# Patient Record
Sex: Female | Born: 1970
Health system: Southern US, Community
[De-identification: ages and names within clinical notes are randomized; demographics above are authoritative.]

## PROBLEM LIST (undated history)

## (undated) DIAGNOSIS — R112 Nausea with vomiting, unspecified: Secondary | ICD-10-CM

## (undated) DIAGNOSIS — Z9889 Other specified postprocedural states: Secondary | ICD-10-CM

## (undated) DIAGNOSIS — I10 Essential (primary) hypertension: Secondary | ICD-10-CM

## (undated) HISTORY — PX: CHOLECYSTECTOMY: SHX55

## (undated) HISTORY — PX: ANKLE SURGERY: SHX546

## (undated) HISTORY — PX: TUBAL LIGATION: SHX77

---

## 2001-10-29 ENCOUNTER — Other Ambulatory Visit: Admission: RE | Admit: 2001-10-29 | Discharge: 2001-10-29 | Payer: Self-pay | Admitting: Family Medicine

## 2002-02-04 ENCOUNTER — Other Ambulatory Visit: Admission: RE | Admit: 2002-02-04 | Discharge: 2002-02-04 | Payer: Self-pay | Admitting: Family Medicine

## 2002-08-19 ENCOUNTER — Encounter: Payer: Self-pay | Admitting: Family Medicine

## 2002-08-19 ENCOUNTER — Ambulatory Visit (HOSPITAL_COMMUNITY): Admission: RE | Admit: 2002-08-19 | Discharge: 2002-08-19 | Payer: Self-pay | Admitting: Family Medicine

## 2003-07-15 ENCOUNTER — Emergency Department (HOSPITAL_COMMUNITY): Admission: EM | Admit: 2003-07-15 | Discharge: 2003-07-15 | Payer: Self-pay | Admitting: Emergency Medicine

## 2005-12-04 ENCOUNTER — Ambulatory Visit: Payer: Self-pay | Admitting: Internal Medicine

## 2005-12-10 ENCOUNTER — Encounter (HOSPITAL_COMMUNITY): Admission: RE | Admit: 2005-12-10 | Discharge: 2006-01-09 | Payer: Self-pay | Admitting: Internal Medicine

## 2006-01-09 ENCOUNTER — Observation Stay (HOSPITAL_COMMUNITY): Admission: RE | Admit: 2006-01-09 | Discharge: 2006-01-10 | Payer: Self-pay | Admitting: General Surgery

## 2006-01-09 ENCOUNTER — Encounter (INDEPENDENT_AMBULATORY_CARE_PROVIDER_SITE_OTHER): Payer: Self-pay | Admitting: General Surgery

## 2010-11-21 ENCOUNTER — Emergency Department (HOSPITAL_COMMUNITY)
Admission: EM | Admit: 2010-11-21 | Discharge: 2010-11-21 | Payer: Self-pay | Source: Home / Self Care | Admitting: Emergency Medicine

## 2010-12-25 ENCOUNTER — Emergency Department (HOSPITAL_COMMUNITY): Payer: Self-pay

## 2010-12-25 ENCOUNTER — Emergency Department (HOSPITAL_COMMUNITY)
Admission: EM | Admit: 2010-12-25 | Discharge: 2010-12-25 | Disposition: A | Discharge status: Home / Self Care | Payer: Self-pay | Attending: Emergency Medicine | Admitting: Emergency Medicine

## 2010-12-25 DIAGNOSIS — N83209 Unspecified ovarian cyst, unspecified side: Secondary | ICD-10-CM | POA: Insufficient documentation

## 2010-12-25 DIAGNOSIS — R109 Unspecified abdominal pain: Secondary | ICD-10-CM | POA: Insufficient documentation

## 2010-12-25 DIAGNOSIS — N760 Acute vaginitis: Secondary | ICD-10-CM | POA: Insufficient documentation

## 2010-12-25 DIAGNOSIS — F329 Major depressive disorder, single episode, unspecified: Secondary | ICD-10-CM | POA: Insufficient documentation

## 2010-12-25 DIAGNOSIS — Z79899 Other long term (current) drug therapy: Secondary | ICD-10-CM | POA: Insufficient documentation

## 2010-12-25 DIAGNOSIS — R10819 Abdominal tenderness, unspecified site: Secondary | ICD-10-CM | POA: Insufficient documentation

## 2010-12-25 DIAGNOSIS — A499 Bacterial infection, unspecified: Secondary | ICD-10-CM | POA: Insufficient documentation

## 2010-12-25 DIAGNOSIS — I10 Essential (primary) hypertension: Secondary | ICD-10-CM | POA: Insufficient documentation

## 2010-12-25 DIAGNOSIS — B9689 Other specified bacterial agents as the cause of diseases classified elsewhere: Secondary | ICD-10-CM | POA: Insufficient documentation

## 2010-12-25 DIAGNOSIS — F3289 Other specified depressive episodes: Secondary | ICD-10-CM | POA: Insufficient documentation

## 2010-12-25 DIAGNOSIS — R110 Nausea: Secondary | ICD-10-CM | POA: Insufficient documentation

## 2010-12-25 LAB — CBC
HCT: 42 % (ref 36.0–46.0)
Hemoglobin: 14.7 g/dL (ref 12.0–15.0)
MCH: 31.3 pg (ref 26.0–34.0)
MCHC: 35 g/dL (ref 30.0–36.0)
MCV: 89.6 fL (ref 78.0–100.0)
Platelets: 218 K/uL (ref 150–400)
RBC: 4.69 MIL/uL (ref 3.87–5.11)
RDW: 13.1 % (ref 11.5–15.5)
WBC: 7.6 K/uL (ref 4.0–10.5)

## 2010-12-25 LAB — URINALYSIS, ROUTINE W REFLEX MICROSCOPIC
Bilirubin Urine: NEGATIVE
Hgb urine dipstick: NEGATIVE
Ketones, ur: NEGATIVE mg/dL
Nitrite: NEGATIVE
Protein, ur: NEGATIVE mg/dL
Specific Gravity, Urine: 1.015 (ref 1.005–1.030)
Urine Glucose, Fasting: NEGATIVE mg/dL
Urobilinogen, UA: 0.2 mg/dL (ref 0.0–1.0)
pH: 6 (ref 5.0–8.0)

## 2010-12-25 LAB — DIFFERENTIAL
Basophils Absolute: 0 K/uL (ref 0.0–0.1)
Basophils Relative: 0 % (ref 0–1)
Eosinophils Absolute: 0.1 10*3/uL (ref 0.0–0.7)
Eosinophils Relative: 1 % (ref 0–5)
Lymphocytes Relative: 24 % (ref 12–46)
Lymphs Abs: 1.8 10*3/uL (ref 0.7–4.0)
Monocytes Absolute: 0.5 10*3/uL (ref 0.1–1.0)
Monocytes Relative: 7 % (ref 3–12)
Neutro Abs: 5.1 10*3/uL (ref 1.7–7.7)
Neutrophils Relative %: 68 % (ref 43–77)

## 2010-12-25 LAB — BASIC METABOLIC PANEL WITH GFR
BUN: 9 mg/dL (ref 6–23)
CO2: 29 meq/L (ref 19–32)
Chloride: 101 meq/L (ref 96–112)
Creatinine, Ser: 0.88 mg/dL (ref 0.4–1.2)
Glucose, Bld: 89 mg/dL (ref 70–99)
Potassium: 4.2 meq/L (ref 3.5–5.1)

## 2010-12-25 LAB — BASIC METABOLIC PANEL
Calcium: 9.8 mg/dL (ref 8.4–10.5)
GFR calc Af Amer: >60 mL/min (ref >60)
GFR calc non Af Amer: >60 mL/min (ref >60)
Sodium: 140 mEq/L (ref 135–145)

## 2010-12-25 LAB — WET PREP, GENITAL
Trich, Wet Prep: NONE SEEN
Yeast Wet Prep HPF POC: NONE SEEN

## 2010-12-25 LAB — RPR: RPR Ser Ql: NONREACTIVE

## 2010-12-25 LAB — POCT PREGNANCY, URINE: Preg Test, Ur: NEGATIVE

## 2010-12-25 MED ORDER — IOHEXOL 300 MG/ML  SOLN: 100.0000 mL | Freq: Once | INTRAMUSCULAR | Status: DC | PRN

## 2010-12-26 LAB — GC/CHLAMYDIA PROBE AMP, GENITAL
Chlamydia, DNA Probe: NEGATIVE
GC Probe Amp, Genital: NEGATIVE

## 2011-04-04 NOTE — H&P (Signed)
[name], Jessica [name]              ACCOUNT NO.:  [id number]   MEDICAL RECORD NO.:  [id number]          PATIENT TYPE:  AMB   LOCATION:  DAY                           FACILITY:  [facility]   PHYSICIAN:  Leroy C. Katrinka Blazing, M.D.   DATE OF BIRTH:  1971/10/19   DATE OF ADMISSION:  DATE OF DISCHARGE:  LH                                HISTORY & PHYSICAL   HISTORY OF PRESENT ILLNESS:  The patient is a 40 year old female with a 4-  month history of right flank pain, back pain with postprandial nausea  without vomiting.  The patient has upper epigastric discomfort and  indigestion, which is controlled by AcipHex.  Ultrasound was negative,  except for hepatomegaly.  HIDA scan showed an ejection fraction of 16.5%.  Other evaluation including LFT's, hepatitis screen was negative.  The  patient is scheduled to have laparoscopic cholecystectomy and liver biopsy  for evaluation of her hepatomegaly.   PAST HISTORY:  1.  Depression.  2.  Anxiety.  3.  Seasonal allergies.  4.  Gastroesophageal reflux disease.   MEDICATIONS:  1.  Zyrtec 10 mg daily.  2.  AcipHex 10 mg daily.  3.  Darvocet p.r.n.  4.  Xanax 0.5 mg p.r.n.   ALLERGIES:  SULFA.   REVIEW OF SYSTEMS:  Positive for chronic fatigue, weight gain, abdominal  bloating.   PHYSICAL EXAMINATION:  VITAL SIGNS:  Blood pressure 162/100, pulse 106,  respirations 20, weight 334 pounds.  HEENT:  Unremarkable.  NECK:  Supple.  No JVD, bruit, adenopathy or thyromegaly.  CHEST:  Clear to auscultation.  HEART:  Regular rate and rhythm without murmur, gallop, or rub.  ABDOMEN:  Soft, nontender.  No masses.  Obese.  Normoactive bowel sounds.  EXTREMITIES:  No cyanosis or clubbing.  There was 1+ edema bilaterally.  NEUROLOGIC:  No focal motor, sensory or cerebellar deficits.   IMPRESSION:  1.  Chronic cholecystitis.  2.  Hepatomegaly of uncertain etiology.  3.  Hypertension.   PLAN:  The patient will have laparoscopic cholecystectomy with liver  biopsy.      Dirk Dress. Katrinka Blazing, M.D.  Electronically Signed     LCS/MEDQ  D:  01/08/2006  T:  01/08/2006  Job:  [id number]   cc:   Kirstie Peri, MD  Fax: [phone]

## 2011-04-04 NOTE — Op Note (Signed)
Jessica [name], Jessica [name]              ACCOUNT NO.:  [id number]   MEDICAL RECORD NO.:  [id number]          PATIENT TYPE:  OBV   LOCATION:  A428                          FACILITY:  [facility]   PHYSICIAN:  Jerolyn Shin C. Katrinka Blazing, M.D.   DATE OF BIRTH:  08-03-1971   DATE OF PROCEDURE:  01/09/2006  DATE OF DISCHARGE:  01/10/2006                                 OPERATIVE REPORT   PREOPERATIVE DIAGNOSIS:  Chronic cholecystitis, hepatomegaly.   POSTOPERATIVE DIAGNOSIS:  Chronic cholecystitis, hepatomegaly with diffuse  fatty infiltration of the liver.   PROCEDURE:  Laparoscopic cholecystectomy with liver biopsy.   SURGEON:  Dr. Katrinka Blazing.   DESCRIPTION:  Under general anesthesia the patient's abdomen was prepped and  draped in a sterile field.  Supraumbilical incision was made.  Veress needle  was inserted uneventfully.  Abdomen was insufflated with 2.5 liters of CO2.  Using an Visiport guide 10 mm port was placed.  Laparoscope was placed.  Under videoscopic guidance a 10-mm port and two 5 mm ports were placed in  the right subcostal region.  The patient was placed in reverse Trendelenburg  position.  Gallbladder was grasped and positioned.  Cystic duct was  dissected, clipped with five clips and divided.  Cystic artery was  dissected, clipped with three clips and divided.  Gallbladder was then  separated from its infrahepatic bed without difficulty.  It was placed in an  EndoCatch device and retrieved.  CO2 insufflation was discontinued.  CO2 was  allowed to escape from the abdomen so that there was better access to the  liver.  Under videoscopic guidance a long liver biopsy needle was placed  transabdominally.  Three good cores of liver tissue were obtained from the  separate areas in the right lobe of the liver.  The areas of biopsy were  cauterized and there was no bleeding.  Irrigation was carried out.  The  remainder of the CO2 was allowed to escape from the abdomen.  The ports were  removed.  The  abdomen was closed using 0 Vicryl on the fascia of the  umbilicus and staples on the skin.  OpSite dressings were placed.  The  patient tolerated procedure well.  She was awakened from anesthesia,  transferred to a bed and taken to the postanesthetic care unit for  monitoring.      Dirk Dress. Katrinka Blazing, M.D.  Electronically Signed     LCS/MEDQ  D:  04/04/2006  T:  04/06/2006  Job:  [id number]

## 2011-10-09 ENCOUNTER — Emergency Department (HOSPITAL_COMMUNITY)
Admission: EM | Admit: 2011-10-09 | Discharge: 2011-10-09 | Disposition: A | Discharge status: Home / Self Care | Payer: Self-pay | Attending: Emergency Medicine | Admitting: Emergency Medicine

## 2011-10-09 ENCOUNTER — Emergency Department (HOSPITAL_COMMUNITY): Payer: Self-pay

## 2011-10-09 DIAGNOSIS — I10 Essential (primary) hypertension: Secondary | ICD-10-CM | POA: Insufficient documentation

## 2011-10-09 DIAGNOSIS — J209 Acute bronchitis, unspecified: Secondary | ICD-10-CM | POA: Insufficient documentation

## 2011-10-09 HISTORY — DX: Essential (primary) hypertension: I10

## 2011-10-09 MED ORDER — AZITHROMYCIN 250 MG PO TABS: 250.0000 mg | ORAL_TABLET | Freq: Every day | ORAL | Status: AC

## 2011-10-09 MED ORDER — HYDROCODONE-ACETAMINOPHEN 5-325 MG PO TABS: 2.0000 | ORAL_TABLET | ORAL | Status: AC | PRN

## 2011-10-09 NOTE — ED Notes (Signed)
Pt presents with cough and URI symptoms x 1 week. Pt states sputum is clear and cough is productive

## 2011-10-09 NOTE — ED Provider Notes (Signed)
Scribed for Felisa Bonier, MD, the patient was seen in room APFT22/APFT22. This chart was scribed by [employer]. The patient's care started at 13:30  CSN: [id number] Arrival date & time: 10/09/2011  1:32 PM   First MD Initiated Contact with Patient 10/09/11 1330      Chief Complaint  Patient presents with  . Cough  . URI   Patient is a 40 y.o. female presenting with cough. The history is provided by the patient.  Cough This is a new problem. The current episode started more than 1 week ago. The problem occurs every few minutes. The problem has been gradually worsening. The cough is productive of sputum. There has been no fever. Associated symptoms include ear congestion, ear pain and myalgias. Pertinent negatives include no chest pain, no chills, no sweats, no headaches, no rhinorrhea, no sore throat, no shortness of breath, no wheezing and no eye redness. She has tried decongestants and cough syrup for the symptoms. The treatment provided mild relief. She is not a smoker.   Jessica [name] is a 40 y.o. female who presents to the Emergency Department complaining of Cough and URI for about 1 week. She reports nasal congestion, productive cough (clear sputum) and fevers. She states that she has taken some Mucinex without any alleviation. She states that she can hear herself wheeze. Patient is actively coughing.  Past Medical History  Diagnosis Date  . Hypertension     Past Surgical History  Procedure Date  . Cholecystectomy   . Tubal ligation   . Ankle surgery     No family history on file.  History  Substance Use Topics  . Smoking status: Never Smoker   . Smokeless tobacco: Not on file  . Alcohol Use: No    OB History    Grav Para Term Preterm Abortions TAB SAB Ect Mult Living                  Review of Systems  Constitutional: Positive for fever. Negative for chills.       10 Systems reviewed and are negative for acute change except as noted in the HPI.  HENT:  Positive for ear pain and congestion. Negative for sore throat and rhinorrhea.   Eyes: Negative for discharge and redness.  Respiratory: Positive for cough. Negative for shortness of breath and wheezing.   Cardiovascular: Negative for chest pain.  Gastrointestinal: Negative for vomiting, abdominal pain and diarrhea.  Genitourinary: Negative for dysuria.  Musculoskeletal: Positive for myalgias. Negative for back pain.  Skin: Negative for rash.  Neurological: Negative for dizziness, syncope, speech difficulty, weakness, numbness and headaches.  Psychiatric/Behavioral: Negative for suicidal ideas, hallucinations and confusion.  All other systems reviewed and are negative.    Allergies  Sulfa antibiotics  Home Medications  No current outpatient prescriptions on file.  BP 132/84  Pulse 88  Temp(Src) 98.1 F (36.7 C) (Oral)  Resp 20  Ht 5\' 9"  (1.753 m)  Wt 309 lb (140.161 kg)  BMI 45.63 kg/m2  SpO2 100%  LMP 10/01/2011  Physical Exam  Nursing note and vitals reviewed. Constitutional: She is oriented to person, place, and time. She appears well-developed and well-nourished.  Non-toxic appearance. She does not have a sickly appearance.  HENT:  Head: Normocephalic and atraumatic.  Right Ear: Hearing, external ear and ear canal normal. No drainage. No mastoid tenderness. Tympanic membrane is not injected, not perforated, not erythematous and not bulging. A middle ear effusion is present. No hemotympanum. No decreased  hearing is noted.  Left Ear: Hearing, external ear and ear canal normal. No drainage. No mastoid tenderness. Tympanic membrane is not injected, not perforated, not erythematous and not bulging. A middle ear effusion is present. No hemotympanum. No decreased hearing is noted.  Nose: Mucosal edema and rhinorrhea present. Right sinus exhibits no maxillary sinus tenderness and no frontal sinus tenderness. Left sinus exhibits no maxillary sinus tenderness and no frontal sinus  tenderness.  Mouth/Throat: Uvula is midline, oropharynx is clear and moist and mucous membranes are normal. No posterior oropharyngeal edema or posterior oropharyngeal erythema.       Some congestion behind the TMs but no erythema bilaterally.   Eyes: Conjunctivae, EOM and lids are normal. Pupils are equal, round, and reactive to light. No scleral icterus.  Neck: Trachea normal and normal range of motion. Neck supple.  Cardiovascular: Regular rhythm and normal heart sounds.   Pulmonary/Chest: Effort normal and breath sounds normal. No accessory muscle usage. Not tachypneic. No respiratory distress. She has no decreased breath sounds. She has no wheezes. She has no rhonchi. She has no rales.  Abdominal: Soft. Normal appearance and bowel sounds are normal. There is no tenderness. There is no rebound, no guarding and no CVA tenderness.  Musculoskeletal: Normal range of motion.  Neurological: She is alert and oriented to person, place, and time. She has normal strength.  Skin: Skin is warm, dry and intact. No rash noted.  Psychiatric: She has a normal mood and affect.    ED Course  Procedures  DIAGNOSTIC STUDIES: Oxygen Saturation is 100% on room air, normal by my interpretation.    COORDINATION OF CARE: 13:35 - EDP examined patient at bedside. Patient to be treated with antibiotics. She is aware of care plan. She understands and is in agreement with plan.   MDM: Acute bronchitis, viral upper respiratory tract infection with cough are both entertained in the patient's differential diagnosis.   Scribe Attestation I personally performed the services described in this documentation, which was scribed in my presence. The recorded information has been reviewed and considered.       Felisa Bonier, MD 10/09/11 [phone]

## 2011-10-27 ENCOUNTER — Emergency Department (HOSPITAL_COMMUNITY): Payer: Self-pay

## 2011-10-27 ENCOUNTER — Encounter (HOSPITAL_COMMUNITY): Payer: Self-pay

## 2011-10-27 ENCOUNTER — Emergency Department (HOSPITAL_COMMUNITY)
Admission: EM | Admit: 2011-10-27 | Discharge: 2011-10-27 | Disposition: A | Discharge status: Home / Self Care | Payer: Self-pay | Attending: Emergency Medicine | Admitting: Emergency Medicine

## 2011-10-27 DIAGNOSIS — R05 Cough: Secondary | ICD-10-CM | POA: Insufficient documentation

## 2011-10-27 DIAGNOSIS — R059 Cough, unspecified: Secondary | ICD-10-CM | POA: Insufficient documentation

## 2011-10-27 DIAGNOSIS — I10 Essential (primary) hypertension: Secondary | ICD-10-CM | POA: Insufficient documentation

## 2011-10-27 MED ORDER — GUAIFENESIN-CODEINE 100-10 MG/5ML PO SYRP: ORAL_SOLUTION | ORAL | Status: DC

## 2011-10-27 NOTE — ED Notes (Signed)
Persistent dry cough for 2-3 weeks

## 2011-10-27 NOTE — ED Provider Notes (Signed)
History     CSN: [id number] Arrival date & time: 10/27/2011  1:54 PM   None     Chief Complaint  Patient presents with  . Cough    (Consider location/radiation/quality/duration/timing/severity/associated sxs/prior treatment) HPI Comments: Pt seen here on thanksgiving today with same sxs.  Given a z-pack with no improvement.    Thinks she needs another one.  Patient is a 40 y.o. female presenting with cough. The history is provided by the patient.  Cough This is a recurrent problem. The problem occurs constantly. The cough is non-productive. There has been no fever. Pertinent negatives include no chills, no shortness of breath and no wheezing. Treatments tried: z-pack. The treatment provided no relief. She is not a smoker.    Past Medical History  Diagnosis Date  . Hypertension     Past Surgical History  Procedure Date  . Cholecystectomy   . Tubal ligation   . Ankle surgery     No family history on file.  History  Substance Use Topics  . Smoking status: Never Smoker   . Smokeless tobacco: Not on file  . Alcohol Use: No    OB History    Grav Para Term Preterm Abortions TAB SAB Ect Mult Living                  Review of Systems  Constitutional: Negative for fever and chills.  Respiratory: Positive for cough. Negative for shortness of breath, wheezing and stridor.   All other systems reviewed and are negative.    Allergies  Sulfa antibiotics  Home Medications     BP 143/91  Pulse 76  Temp(Src) 98.3 F (36.8 C) (Oral)  Resp 18  Ht 5\' 8"  (1.727 m)  Wt 309 lb (140.161 kg)  BMI 46.98 kg/m2  SpO2 100%  LMP 10/01/2011  Physical Exam  Nursing note and vitals reviewed. Constitutional: She is oriented to person, place, and time. She appears well-developed and well-nourished. No distress.  HENT:  Head: Normocephalic and atraumatic.  Right Ear: Tympanic membrane, external ear and ear canal normal.  Left Ear: Hearing, tympanic membrane, external ear and  ear canal normal.  Mouth/Throat: Uvula is midline and mucous membranes are normal. No uvula swelling. No oropharyngeal exudate, posterior oropharyngeal edema, posterior oropharyngeal erythema or tonsillar abscesses.  Eyes: EOM are normal.  Neck: Normal range of motion.  Cardiovascular: Normal rate, regular rhythm and normal heart sounds.  Exam reveals no gallop and no friction rub.   No murmur heard. Pulmonary/Chest: Effort normal and breath sounds normal. No accessory muscle usage. Not tachypneic. No respiratory distress. She has no wheezes. She has no rhonchi. She has no rales. She exhibits no tenderness.  Abdominal: Soft. She exhibits no distension. There is no tenderness.  Musculoskeletal: Normal range of motion.  Lymphadenopathy:    She has no cervical adenopathy.  Neurological: She is alert and oriented to person, place, and time.  Skin: Skin is warm and dry.  Psychiatric: She has a normal mood and affect. Judgment normal.    ED Course  Procedures (including critical care time)  Labs Reviewed - No data to display Dg Chest 2 View  10/27/2011  *RADIOLOGY REPORT*  Clinical Data: Cough and congestion  CHEST - 2 VIEW  Comparison: None.  Findings: Heart size is within normal limits.  Normal vascularity. Negative for pneumonia or effusion.  Lungs are clear.  IMPRESSION: No active cardiopulmonary disease.  Original Report Authenticated By: Camelia Phenes, M.D.     No  diagnosis found.    MDM          Worthy Rancher, PA 10/27/11 [phone]

## 2011-10-27 NOTE — ED Notes (Signed)
C/o cough since week of Thanksgiving. Pt states " diagnosed with bronchitis and it is not getting any better"

## 2011-10-28 NOTE — ED Provider Notes (Signed)
Medical screening examination/treatment/procedure(s) were performed by non-physician practitioner and as supervising physician I was immediately available for consultation/collaboration.    Joya Gaskins, MD 10/28/11 [phone]

## 2016-01-25 ENCOUNTER — Other Ambulatory Visit: Payer: Self-pay | Admitting: Orthopaedic Surgery

## 2016-01-26 NOTE — Telephone Encounter (Signed)
rx done

## 2016-02-20 ENCOUNTER — Ambulatory Visit: Payer: Self-pay | Admitting: Orthopaedic Surgery

## 2016-03-12 ENCOUNTER — Ambulatory Visit (INDEPENDENT_AMBULATORY_CARE_PROVIDER_SITE_OTHER): Payer: BLUE CROSS/BLUE SHIELD | Admitting: Orthopaedic Surgery

## 2016-03-12 ENCOUNTER — Encounter: Payer: Self-pay | Admitting: Orthopaedic Surgery

## 2016-03-12 VITALS — BP 136/89 | HR 87 | Temp 97.7°F | Resp 16 | Ht 68.0 in | Wt 300.0 lb

## 2016-03-12 DIAGNOSIS — M79671 Pain in right foot: Secondary | ICD-10-CM | POA: Diagnosis not present

## 2016-03-12 DIAGNOSIS — G8929 Other chronic pain: Secondary | ICD-10-CM | POA: Diagnosis not present

## 2016-03-12 MED ORDER — HYDROCODONE-ACETAMINOPHEN 5-325 MG PO TABS: 1.0000 | ORAL_TABLET | ORAL | Status: DC | PRN

## 2016-03-12 NOTE — Progress Notes (Signed)
Patient ON:GEXBMWU:Jessica [name], female DOB:05/17/1971, 45 y.o. XLK:[id number]  Chief Complaint  Patient presents with  . Follow-up    right heel pain     HPI  Jessica LevelFelicia G [name] is a 45 y.o. female who has chronic right heel pain.  She has no new problem.  She has inserts, uses ice, elevates the heel.  It hurts first thing in the morning and after sitting a while.  She has done stretching exercises.  She is taking her medicine.  She has no swelling, no redness.  HPI  Body mass index is 45.63 kg/(m^2).  Review of Systems  HENT: Negative for congestion.   Respiratory: Negative for cough and shortness of breath.   Cardiovascular: Negative for chest pain and leg swelling.  Endocrine: Positive for cold intolerance.  Musculoskeletal: Positive for arthralgias and gait problem.  Allergic/Immunologic: Positive for environmental allergies.    Past Medical History  Diagnosis Date  . Hypertension     Past Surgical History  Procedure Laterality Date  . Cholecystectomy    . Tubal ligation    . Ankle surgery      History reviewed. No pertinent family history.  Social History Social History  Substance Use Topics  . Smoking status: Never Smoker   . Smokeless tobacco: None  . Alcohol Use: No    Allergies  Allergen Reactions  . Sulfa Antibiotics Rash    Current Outpatient Prescriptions  Medication Sig Dispense Refill  . atenolol (TENORMIN) 25 MG tablet Take 25 mg by mouth daily.      . cetirizine (ZYRTEC) 10 MG tablet Take 10 mg by mouth daily.      . diclofenac (VOLTAREN) 75 MG EC tablet TAKE ONE TABLET BY MOUTH TWICE DAILY 60 tablet 5  . DULoxetine (CYMBALTA) 30 MG capsule Take 30 mg by mouth daily.    . norethindrone (MICRONOR,CAMILA,ERRIN) 0.35 MG tablet Take 1 tablet by mouth daily.      Marland Kitchen. HYDROcodone-acetaminophen (NORCO/VICODIN) 5-325 MG tablet Take 1 tablet by mouth every 4 (four) hours as needed for moderate pain (Must last 30 days.  Do not take and drive a car or use  machinery.). 120 tablet 0   No current facility-administered medications for this visit.     Physical Exam  Blood pressure 136/89, pulse 87, temperature 97.7 F (36.5 C), resp. rate 16, height 5\' 8"  (1.727 m), weight 300 lb (136.079 kg).  Constitutional: overall normal hygiene, normal nutrition, well developed, normal grooming, normal body habitus. Assistive device:none  Musculoskeletal: gait and station Limp none, muscle tone and strength are normal, no tremors or atrophy is present.  .  Neurological: coordination overall normal.  Deep tendon reflex/nerve stretch intact.  Sensation normal.  Cranial nerves II-XII intact.   Skin:   normal overall no scars, lesions, ulcers or rashes. No psoriasis.  Psychiatric: Alert and oriented x 3.  Recent memory intact, remote memory unclear.  Normal mood and affect. Well groomed.  Good eye contact.  Cardiovascular: overall no swelling, no varicosities, no edema bilaterally, normal temperatures of the legs and arms, no clubbing, cyanosis and good capillary refill.  Lymphatic: palpation is normal.   Extremities:the right heel is tender in the center area, the left heel is normal. Inspection normal Strength and tone normal Range of motion full of both feet and ankles.  The patient has been educated about the nature of the problem(s) and counseled on treatment options.  The patient appeared to understand what I have discussed and is in agreement with it.  Encounter Diagnosis  Name Primary?  . Heel pain, chronic, right Yes    PLAN Call if any problems.  Precautions discussed.  Continue current medications.   Return to clinic 3 months

## 2016-03-13 ENCOUNTER — Ambulatory Visit: Payer: Self-pay | Admitting: Orthopaedic Surgery

## 2016-06-18 ENCOUNTER — Ambulatory Visit (INDEPENDENT_AMBULATORY_CARE_PROVIDER_SITE_OTHER): Payer: BLUE CROSS/BLUE SHIELD | Admitting: Orthopaedic Surgery

## 2016-06-18 ENCOUNTER — Encounter: Payer: Self-pay | Admitting: Orthopaedic Surgery

## 2016-06-18 VITALS — BP 135/85 | HR 87 | Temp 97.7°F | Ht 68.0 in | Wt 308.0 lb

## 2016-06-18 DIAGNOSIS — M79672 Pain in left foot: Secondary | ICD-10-CM

## 2016-06-18 DIAGNOSIS — G8929 Other chronic pain: Secondary | ICD-10-CM | POA: Diagnosis not present

## 2016-06-18 DIAGNOSIS — M79671 Pain in right foot: Secondary | ICD-10-CM | POA: Diagnosis not present

## 2016-06-18 MED ORDER — HYDROCODONE-ACETAMINOPHEN 5-325 MG PO TABS: 1.0000 | ORAL_TABLET | ORAL | 0 refills | Status: DC | PRN

## 2016-06-18 NOTE — Progress Notes (Signed)
Patient Jessica [name], female DOB:[birth year], 45 y.o. NWG:[id number]  Chief Complaint  Patient presents with  . New Problem    Left foot pain    HPI  Jessica [name] is a 45 y.o. female who has chronic pain of the right heel that is improved.  She has now new pain of the left heel in the same area as the right. She has no new trauma.  She has no redness or swelling.  She does not have a limp. She is doing the stretching and using ice which helps.  HPI  Body mass index is 46.83 kg/m.  ROS  Review of Systems  HENT: Negative for congestion.   Respiratory: Negative for cough and shortness of breath.   Cardiovascular: Negative for chest pain and leg swelling.  Endocrine: Positive for cold intolerance.  Musculoskeletal: Positive for arthralgias and gait problem.  Allergic/Immunologic: Positive for environmental allergies.    Past Medical History:  Diagnosis Date  . Hypertension     Past Surgical History:  Procedure Laterality Date  . ANKLE SURGERY    . CHOLECYSTECTOMY    . TUBAL LIGATION      History reviewed. No pertinent family history.  Social History Social History  Substance Use Topics  . Smoking status: Never Smoker  . Smokeless tobacco: Never Used  . Alcohol use No    Allergies  Allergen Reactions  . Sulfa Antibiotics Rash    Current Outpatient Prescriptions  Medication Sig Dispense Refill  . atenolol (TENORMIN) 25 MG tablet Take 25 mg by mouth daily.      . cetirizine (ZYRTEC) 10 MG tablet Take 10 mg by mouth daily.      . diclofenac (VOLTAREN) 75 MG EC tablet TAKE ONE TABLET BY MOUTH TWICE DAILY 60 tablet 5  . DULoxetine (CYMBALTA) 30 MG capsule Take 30 mg by mouth daily.    Marland Kitchen HYDROcodone-acetaminophen (NORCO/VICODIN) 5-325 MG tablet Take 1 tablet by mouth every 4 (four) hours as needed for moderate pain (Must last 30 days.  Do not take and drive a car or use machinery.). 120 tablet 0  . norethindrone (MICRONOR,CAMILA,ERRIN) 0.35 MG tablet Take 1  tablet by mouth daily.       No current facility-administered medications for this visit.      Physical Exam  Blood pressure 135/85, pulse 87, temperature 97.7 F (36.5 C), height  (1.727 m), weight (!) 308 lb (139.7 kg).  Constitutional: overall normal hygiene, normal nutrition, well developed, normal grooming, normal body habitus. Assistive device:none  Musculoskeletal: gait and station Limp left, muscle tone and strength are normal, no tremors or atrophy is present.  .  Neurological: coordination overall normal.  Deep tendon reflex/nerve stretch intact.  Sensation normal.  Cranial nerves II-XII intact.   Skin:   normal overall no scars, lesions, ulcers or rashes. No psoriasis.  Psychiatric: Alert and oriented x 3.  Recent memory intact, remote memory unclear.  Normal mood and affect. Well groomed.  Good eye contact.  Cardiovascular: overall no swelling, no varicosities, no edema bilaterally, normal temperatures of the legs and arms, no clubbing, cyanosis and good capillary refill.  Lymphatic: palpation is normal.  The right heel is only slightly tender.  She has no swelling or redness.  The left heel has some tenderness but no swelling or redness. There is a slight limp to the left.  ROM of both ankles full.  The patient has been educated about the nature of the problem(s) and counseled on treatment options.  The patient appeared to understand what I have discussed and is in agreement with it.  Encounter Diagnoses  Name Primary?  . Heel pain, chronic, right Yes  . Heel pain, left     PLAN Call if any problems.  Precautions discussed.  Continue current medications.   Return to clinic 3 months   Electronically Signed Darreld Mclean, MD 8/2/201710:47 AM

## 2016-09-23 ENCOUNTER — Encounter: Payer: Self-pay | Admitting: Orthopaedic Surgery

## 2016-09-23 ENCOUNTER — Ambulatory Visit (INDEPENDENT_AMBULATORY_CARE_PROVIDER_SITE_OTHER): Payer: BLUE CROSS/BLUE SHIELD | Admitting: Orthopaedic Surgery

## 2016-09-23 VITALS — BP 118/81 | HR 84 | Temp 97.7°F | Ht 68.0 in | Wt 314.0 lb

## 2016-09-23 DIAGNOSIS — G8929 Other chronic pain: Secondary | ICD-10-CM | POA: Diagnosis not present

## 2016-09-23 DIAGNOSIS — M79671 Pain in right foot: Secondary | ICD-10-CM | POA: Diagnosis not present

## 2016-09-23 MED ORDER — HYDROCODONE-ACETAMINOPHEN 5-325 MG PO TABS: 1.0000 | ORAL_TABLET | ORAL | 0 refills | Status: DC | PRN

## 2016-09-23 NOTE — Progress Notes (Signed)
Patient Jessica [name], female DOB:10/08/1971, 45 y.o. NWG:[id number]  Chief Complaint  Patient presents with  . Follow-up    Heel pain    HPI  [name] is a 45 y.o. female who has chronic pain of the right heel. It is better today. She has no redness or new trauma. She has no numbness or swelling. HPI  Body mass index is 47.74 kg/m.  ROS  Review of Systems  HENT: Negative for congestion.   Respiratory: Negative for cough and shortness of breath.   Cardiovascular: Negative for chest pain and leg swelling.  Endocrine: Positive for cold intolerance.  Musculoskeletal: Positive for arthralgias and gait problem.  Allergic/Immunologic: Positive for environmental allergies.    Past Medical History:  Diagnosis Date  . Hypertension     Past Surgical History:  Procedure Laterality Date  . ANKLE SURGERY    . CHOLECYSTECTOMY    . TUBAL LIGATION      History reviewed. No pertinent family history.  Social History Social History  Substance Use Topics  . Smoking status: Never Smoker  . Smokeless tobacco: Never Used  . Alcohol use No    Allergies  Allergen Reactions  . Sulfa Antibiotics Rash    Current Outpatient Prescriptions  Medication Sig Dispense Refill  . atenolol (TENORMIN) 25 MG tablet Take 25 mg by mouth daily.      . cetirizine (ZYRTEC) 10 MG tablet Take 10 mg by mouth daily.      . diclofenac (VOLTAREN) 75 MG EC tablet TAKE ONE TABLET BY MOUTH TWICE DAILY 60 tablet 5  . DULoxetine (CYMBALTA) 30 MG capsule Take 30 mg by mouth daily.    Marland Kitchen. HYDROcodone-acetaminophen (NORCO/VICODIN) 5-325 MG tablet Take 1 tablet by mouth every 4 (four) hours as needed for moderate pain (Must last 30 days.  Do not take and drive a car or use machinery.). 120 tablet 0  . norethindrone (MICRONOR,CAMILA,ERRIN) 0.35 MG tablet Take 1 tablet by mouth daily.       No current facility-administered medications for this visit.      Physical Exam  Blood pressure 118/81, pulse  84, temperature 97.7 F (36.5 C), height 5\' 8"  (1.727 m), weight (!) 314 lb (142.4 kg).  Constitutional: overall normal hygiene, normal nutrition, well developed, normal grooming, normal body habitus. Assistive device:none  Musculoskeletal: gait and station Limp none, muscle tone and strength are normal, no tremors or atrophy is present.  .  Neurological: coordination overall normal.  Deep tendon reflex/nerve stretch intact.  Sensation normal.  Cranial nerves II-XII intact.   Skin:   Normal overall no scars, lesions, ulcers or rashes. No psoriasis.  Psychiatric: Alert and oriented x 3.  Recent memory intact, remote memory unclear.  Normal mood and affect. Well groomed.  Good eye contact.  Cardiovascular: overall no swelling, no varicosities, no edema bilaterally, normal temperatures of the legs and arms, no clubbing, cyanosis and good capillary refill.  Lymphatic: palpation is normal.  He right heel is tender slightly on the plantar surface.  She has no redness or swelling.  Gait is normal.  The patient has been educated about the nature of the problem(s) and counseled on treatment options.  The patient appeared to understand what I have discussed and is in agreement with it.  Encounter Diagnosis  Name Primary?  . Heel pain, chronic, right Yes    PLAN Call if any problems.  Precautions discussed.  Continue current medications.   Return to clinic prn   Electronically Signed Deniece PortelaWayne  Hilda LiasKeeling, MD 11/7/20179:29 AM

## 2016-09-30 ENCOUNTER — Encounter: Payer: Self-pay | Admitting: Radiology

## 2016-09-30 ENCOUNTER — Ambulatory Visit: Payer: BLUE CROSS/BLUE SHIELD | Admitting: Orthopaedic Surgery

## 2016-09-30 NOTE — Progress Notes (Unsigned)
The Rx for #120 Hydrocodone 5-325mg  is not approved by [employer] of [place].  The patient was given a 7 day supply from [facility] and a prior Aauthorization request sent to us to do.  I called and [facility] 30 minutes [facility] and never got an ans.  I called the patient to tell her she will need to find out what the requirements of her insurance is before any more pain medicine can be prescribed.

## 2016-10-06 NOTE — Progress Notes (Signed)
Noted. Refused.  

## 2017-05-26 ENCOUNTER — Encounter: Payer: Self-pay | Admitting: Orthopaedic Surgery

## 2017-05-26 ENCOUNTER — Ambulatory Visit (INDEPENDENT_AMBULATORY_CARE_PROVIDER_SITE_OTHER): Payer: 59 | Admitting: Orthopaedic Surgery

## 2017-05-26 VITALS — BP 125/82 | HR 92 | Temp 96.9°F | Ht 68.0 in | Wt 326.0 lb

## 2017-05-26 DIAGNOSIS — G8929 Other chronic pain: Secondary | ICD-10-CM | POA: Diagnosis not present

## 2017-05-26 DIAGNOSIS — M79671 Pain in right foot: Secondary | ICD-10-CM | POA: Diagnosis not present

## 2017-05-26 DIAGNOSIS — M79672 Pain in left foot: Secondary | ICD-10-CM | POA: Diagnosis not present

## 2017-05-26 MED ORDER — HYDROCODONE-ACETAMINOPHEN 5-325 MG PO TABS: 1.0000 | ORAL_TABLET | Freq: Four times a day (QID) | ORAL | 0 refills | Status: DC | PRN

## 2017-05-26 NOTE — Progress Notes (Signed)
Patient ZO:XWRUEAV Curley Spice, female DOB:February 07, 1971, 46 y.o. WUJ:[id number]  Chief Complaint  Patient presents with  . Follow-up    heel pain    HPI  VERNEE BAINES is a 46 y.o. female who has chronic and recurrent heel pain.  She has been doing her stretching exercises.  She has no new trauma. She is working more.    I have told her to get some new inserts for her shoes and continue the stretching plus take NSAIDs. HPI  Body mass index is 49.57 kg/m.  ROS  Review of Systems  HENT: Negative for congestion.   Respiratory: Negative for cough and shortness of breath.   Cardiovascular: Negative for chest pain and leg swelling.  Endocrine: Positive for cold intolerance.  Musculoskeletal: Positive for arthralgias and gait problem.  Allergic/Immunologic: Positive for environmental allergies.    Past Medical History:  Diagnosis Date  . Hypertension     Past Surgical History:  Procedure Laterality Date  . ANKLE SURGERY    . CHOLECYSTECTOMY    . TUBAL LIGATION      No family history on file.  Social History Social History  Substance Use Topics  . Smoking status: Never Smoker  . Smokeless tobacco: Never Used  . Alcohol use No    Allergies  Allergen Reactions  . Sulfa Antibiotics Rash    Current Outpatient Prescriptions  Medication Sig Dispense Refill  . atenolol (TENORMIN) 25 MG tablet Take 25 mg by mouth daily.      . cetirizine (ZYRTEC) 10 MG tablet Take 10 mg by mouth daily.      . diclofenac (VOLTAREN) 75 MG EC tablet TAKE ONE TABLET BY MOUTH TWICE DAILY 60 tablet 5  . DULoxetine (CYMBALTA) 30 MG capsule Take 30 mg by mouth daily.    Marland Kitchen HYDROcodone-acetaminophen (NORCO/VICODIN) 5-325 MG tablet Take 1 tablet by mouth every 6 (six) hours as needed for moderate pain (Must last 30 days.Do not take and drive a car or use machinery.). 60 tablet 0  . norethindrone (MICRONOR,CAMILA,ERRIN) 0.35 MG tablet Take 1 tablet by mouth daily.       No current  facility-administered medications for this visit.      Physical Exam  Blood pressure 125/82, pulse 92, temperature (!) 96.9 F (36.1 C), height 5\' 8"  (1.727 m), weight (!) 326 lb (147.9 kg).  Constitutional: overall normal hygiene, normal nutrition, well developed, normal grooming, normal body habitus. Assistive device:none  Musculoskeletal: gait and station Limp left, muscle tone and strength are normal, no tremors or atrophy is present.  .  Neurological: coordination overall normal.  Deep tendon reflex/nerve stretch intact.  Sensation normal.  Cranial nerves II-XII intact.   Skin:   Normal overall no scars, lesions, ulcers or rashes. No psoriasis.  Psychiatric: Alert and oriented x 3.  Recent memory intact, remote memory unclear.  Normal mood and affect. Well groomed.  Good eye contact.  Cardiovascular: overall no swelling, no varicosities, no edema bilaterally, normal temperatures of the legs and arms, no clubbing, cyanosis and good capillary refill.  Lymphatic: palpation is normal.  Both heels are tender, more in the center of each and more on the left.  NV intact. ROM of the ankles and toes normal.    The patient has been educated about the nature of the problem(s) and counseled on treatment options.  The patient appeared to understand what I have discussed and is in agreement with it.  Encounter Diagnoses  Name Primary?  . Heel pain, chronic, right Yes  .  Chronic heel pain, left     PLAN Call if any problems.  Precautions discussed.  Continue current medications.   Return to clinic 1 month   I have [facility] the [facility] web site prior to prescribing narcotic medicine for this patient.  Electronically Signed Darreld McleanWayne Harsha Yusko, MD 7/10/20182:59 PM

## 2017-06-23 ENCOUNTER — Ambulatory Visit: Payer: 59 | Admitting: Orthopaedic Surgery

## 2017-08-04 ENCOUNTER — Ambulatory Visit (INDEPENDENT_AMBULATORY_CARE_PROVIDER_SITE_OTHER): Payer: 59 | Admitting: Orthopaedic Surgery

## 2017-08-04 VITALS — BP 128/86 | HR 83 | Temp 97.2°F | Ht 68.0 in | Wt 326.0 lb

## 2017-08-04 DIAGNOSIS — M79671 Pain in right foot: Secondary | ICD-10-CM | POA: Diagnosis not present

## 2017-08-04 DIAGNOSIS — G8929 Other chronic pain: Secondary | ICD-10-CM

## 2017-08-04 MED ORDER — HYDROCODONE-ACETAMINOPHEN 5-325 MG PO TABS: 1.0000 | ORAL_TABLET | Freq: Four times a day (QID) | ORAL | 0 refills | Status: DC | PRN

## 2017-08-04 NOTE — Progress Notes (Signed)
Patient Jessica [name], female DOB:06/20/1971, 46 y.o. FAO:[id number]  Chief Complaint  Patient presents with  . Follow-up    heel pain    HPI  Jessica [name] is a 46 y.o. female who has bilateral heel pain, more on the left.  She has no new trauma.  She has new shoes.  She is better.  She is doing her stretching exercises. HPI  Body mass index is 49.57 kg/m.  ROS  Review of Systems  Past Medical History:  Diagnosis Date  . Hypertension     Past Surgical History:  Procedure Laterality Date  . ANKLE SURGERY    . CHOLECYSTECTOMY    . TUBAL LIGATION      No family history on file.  Social History Social History  Substance Use Topics  . Smoking status: Never Smoker  . Smokeless tobacco: Never Used  . Alcohol use No    Allergies  Allergen Reactions  . Sulfa Antibiotics Rash    Current Outpatient Prescriptions  Medication Sig Dispense Refill  . atenolol (TENORMIN) 25 MG tablet Take 25 mg by mouth daily.      . cetirizine (ZYRTEC) 10 MG tablet Take 10 mg by mouth daily.      . diclofenac (VOLTAREN) 75 MG EC tablet TAKE ONE TABLET BY MOUTH TWICE DAILY 60 tablet 5  . DULoxetine (CYMBALTA) 30 MG capsule Take 30 mg by mouth daily.    Marland Kitchen HYDROcodone-acetaminophen (NORCO/VICODIN) 5-325 MG tablet Take 1 tablet by mouth every 6 (six) hours as needed for moderate pain (Must last 30 days.Do not take and drive a car or use machinery.). 60 tablet 0  . norethindrone (MICRONOR,CAMILA,ERRIN) 0.35 MG tablet Take 1 tablet by mouth daily.       No current facility-administered medications for this visit.      Physical Exam  Blood pressure 128/86, pulse 83, temperature (!) 97.2 F (36.2 C), height  (1.727 m), weight (!) 326 lb (147.9 kg).  Constitutional: overall normal hygiene, normal nutrition, well developed, normal grooming, normal body habitus. Assistive device:none  Musculoskeletal: gait and station Limp none, muscle tone and strength are normal, no tremors  or atrophy is present.  .  Neurological: coordination overall normal.  Deep tendon reflex/nerve stretch intact.  Sensation normal.  Cranial nerves II-XII intact.   Skin:   Normal overall no scars, lesions, ulcers or rashes. No psoriasis.  Psychiatric: Alert and oriented x 3.  Recent memory intact, remote memory unclear.  Normal mood and affect. Well groomed.  Good eye contact.  Cardiovascular: overall no swelling, no varicosities, no edema bilaterally, normal temperatures of the legs and arms, no clubbing, cyanosis and good capillary refill.  Lymphatic: palpation is normal.  All other systems reviewed and are negative   The left heel is tender over the plantar area with no redness or swelling.  NV intact.  ROM of the ankles is normal.  The patient has been educated about the nature of the problem(s) and counseled on treatment options.  The patient appeared to understand what I have discussed and is in agreement with it.  Encounter Diagnosis  Name Primary?  . Heel pain, chronic, right Yes    PLAN Call if any problems.  Precautions discussed.  Continue current medications.   Return to clinic 3 months   I have reviewed the [facility] Controlled Substance Reporting System web site prior to prescribing narcotic medicine for this patient.  Electronically Signed Darreld Mclean, MD 9/18/20188:23 AM

## 2017-11-03 ENCOUNTER — Ambulatory Visit: Payer: 59 | Admitting: Orthopaedic Surgery

## 2017-12-03 ENCOUNTER — Ambulatory Visit: Payer: 59 | Admitting: Orthopaedic Surgery

## 2017-12-10 ENCOUNTER — Ambulatory Visit (INDEPENDENT_AMBULATORY_CARE_PROVIDER_SITE_OTHER): Payer: 59

## 2017-12-10 ENCOUNTER — Encounter: Payer: Self-pay | Admitting: Orthopaedic Surgery

## 2017-12-10 ENCOUNTER — Ambulatory Visit (INDEPENDENT_AMBULATORY_CARE_PROVIDER_SITE_OTHER): Payer: 59 | Admitting: Orthopaedic Surgery

## 2017-12-10 VITALS — BP 120/78 | HR 92 | Ht 68.0 in | Wt 320.0 lb

## 2017-12-10 DIAGNOSIS — M25572 Pain in left ankle and joints of left foot: Secondary | ICD-10-CM

## 2017-12-10 MED ORDER — HYDROCODONE-ACETAMINOPHEN 5-325 MG PO TABS: ORAL_TABLET | ORAL | 0 refills | Status: DC

## 2017-12-10 NOTE — Progress Notes (Signed)
Patient [name], female DOB:03/06/1971, 47 y.o. WUJ:[id number]  Chief Complaint  Patient presents with  . Foot Pain    left    HPI  Jessica [name] is a 47 y.o. female who has left ankle pain.  She had a fracture of the left ankle years ago that I treated with surgery.  She has done well until recently of the ankle.  She has had heel pain but that is better.  She has no new trauma, no swelling, no redness, no numbness.  She has taken ibuprofen but it only helps some when it is really hurting. HPI  Body mass index is 48.66 kg/m.  ROS  Review of Systems  Musculoskeletal: Positive for arthralgias and gait problem.  All other systems reviewed and are negative.   Past Medical History:  Diagnosis Date  . Hypertension     Past Surgical History:  Procedure Laterality Date  . ANKLE SURGERY    . CHOLECYSTECTOMY    . TUBAL LIGATION      History reviewed. No pertinent family history.  Social History Social History   Tobacco Use  . Smoking status: Never Smoker  . Smokeless tobacco: Never Used  Substance Use Topics  . Alcohol use: No  . Drug use: No    Allergies  Allergen Reactions  . Sulfa Antibiotics Rash    Current Outpatient Medications  Medication Sig Dispense Refill  . atenolol (TENORMIN) 25 MG tablet Take 25 mg by mouth daily.      . cetirizine (ZYRTEC) 10 MG tablet Take 10 mg by mouth daily.      . diclofenac (VOLTAREN) 75 MG EC tablet TAKE ONE TABLET BY MOUTH TWICE DAILY 60 tablet 5  . DULoxetine (CYMBALTA) 30 MG capsule Take 30 mg by mouth daily.    . norethindrone (MICRONOR,CAMILA,ERRIN) 0.35 MG tablet Take 1 tablet by mouth daily.      Marland Kitchen. HYDROcodone-acetaminophen (NORCO/VICODIN) 5-325 MG tablet One tablet every four hours as needed for acute pain.  Limit of five days per [place]. 30 tablet 0   No current facility-administered medications for this visit.      Physical Exam  Blood pressure 120/78, pulse 92, height 5\' 8"  (1.727 m),  weight (!) 320 lb (145.2 kg).  Constitutional: overall normal hygiene, normal nutrition, well developed, normal grooming, normal body habitus. Assistive device:none  Musculoskeletal: gait and station Limp none, muscle tone and strength are normal, no tremors or atrophy is present.  .  Neurological: coordination overall normal.  Deep tendon reflex/nerve stretch intact.  Sensation normal.  Cranial nerves II-XII intact.   Skin:   Normal overall no scars, lesions, ulcers or rashes. No psoriasis.  Psychiatric: Alert and oriented x 3.  Recent memory intact, remote memory unclear.  Normal mood and affect. Well groomed.  Good eye contact.  Cardiovascular: overall no swelling, no varicosities, no edema bilaterally, normal temperatures of the legs and arms, no clubbing, cyanosis and good capillary refill.  Lymphatic: palpation is normal.  Left ankle with well healed scar laterally. ROM is full.  No specific area of tenderness.  No limp.  NV intact.  All other systems reviewed and are negative   The patient has been educated about the nature of the problem(s) and counseled on treatment options.  The patient appeared to understand what I have discussed and is in agreement with it.  Encounter Diagnosis  Name Primary?  . Pain of joint of left ankle and foot Yes    PLAN Call  if any problems.  Precautions discussed.  Continue current medications.   Return to clinic 1 month   Electronically Signed Darreld Mclean, MD 1/24/20193:00 PM

## 2018-01-07 ENCOUNTER — Encounter: Payer: Self-pay | Admitting: Orthopaedic Surgery

## 2018-01-07 ENCOUNTER — Ambulatory Visit (INDEPENDENT_AMBULATORY_CARE_PROVIDER_SITE_OTHER): Payer: 59 | Admitting: Orthopaedic Surgery

## 2018-01-07 VITALS — BP 132/86 | HR 92 | Temp 97.8°F | Ht 68.0 in | Wt 325.0 lb

## 2018-01-07 DIAGNOSIS — M79671 Pain in right foot: Secondary | ICD-10-CM

## 2018-01-07 DIAGNOSIS — M25572 Pain in left ankle and joints of left foot: Secondary | ICD-10-CM | POA: Diagnosis not present

## 2018-01-07 DIAGNOSIS — G8929 Other chronic pain: Secondary | ICD-10-CM

## 2018-01-07 MED ORDER — HYDROCODONE-ACETAMINOPHEN 5-325 MG PO TABS: ORAL_TABLET | ORAL | 0 refills | Status: DC

## 2018-01-07 NOTE — Progress Notes (Signed)
Patient ON:Jessica [name], female DOB:[birth year], 47 y.o. XLK:[id number]  Chief Complaint  Patient presents with  . Ankle Pain    left    HPI  Jessica [name] is a 47 y.o. female who has pain of the left ankle and heel.  She is still having some pain but no new trauma.  Her heel pain is less.  She has no redness.  She is taking her medicine.  She has a limp some days. HPI  Body mass index is 49.42 kg/m.  ROS  Review of Systems  Musculoskeletal: Positive for arthralgias and gait problem.  All other systems reviewed and are negative.   Past Medical History:  Diagnosis Date  . Hypertension     Past Surgical History:  Procedure Laterality Date  . ANKLE SURGERY    . CHOLECYSTECTOMY    . TUBAL LIGATION      History reviewed. No pertinent family history.  Social History Social History   Tobacco Use  . Smoking status: Never Smoker  . Smokeless tobacco: Never Used  Substance Use Topics  . Alcohol use: No  . Drug use: No    Allergies  Allergen Reactions  . Sulfa Antibiotics Rash    Current Outpatient Medications  Medication Sig Dispense Refill  . atenolol (TENORMIN) 25 MG tablet Take 25 mg by mouth daily.      . cetirizine (ZYRTEC) 10 MG tablet Take 10 mg by mouth daily.      . diclofenac (VOLTAREN) 75 MG EC tablet TAKE ONE TABLET BY MOUTH TWICE DAILY 60 tablet 5  . DULoxetine (CYMBALTA) 30 MG capsule Take 30 mg by mouth daily.    Marland Kitchen HYDROcodone-acetaminophen (NORCO/VICODIN) 5-325 MG tablet One tablet every four hours as needed for acute pain. 36 tablet 0  . norethindrone (MICRONOR,CAMILA,ERRIN) 0.35 MG tablet Take 1 tablet by mouth daily.       No current facility-administered medications for this visit.      Physical Exam  Blood pressure 132/86, pulse 92, temperature 97.8 F (36.6 C), height 5\' 8"  (1.727 m), weight (!) 325 lb (147.4 kg).  Constitutional: overall normal hygiene, normal nutrition, well developed, normal grooming, normal body  habitus. Assistive device:none  Musculoskeletal: gait and station Limp left, muscle tone and strength are normal, no tremors or atrophy is present.  .  Neurological: coordination overall normal.  Deep tendon reflex/nerve stretch intact.  Sensation normal.  Cranial nerves II-XII intact.   Skin:   Normal overall no scars, lesions, ulcers or rashes. No psoriasis.  Psychiatric: Alert and oriented x 3.  Recent memory intact, remote memory unclear.  Normal mood and affect. Well groomed.  Good eye contact.  Cardiovascular: overall no swelling, no varicosities, no edema bilaterally, normal temperatures of the legs and arms, no clubbing, cyanosis and good capillary refill.  Lymphatic: palpation is normal.  Left ankle has some lateral anterior talofibular joint pain.  ROM is full.  She has slight heel pain on the mid plantar area.  No redness or swelling is present.  All other systems reviewed and are negative   The patient has been educated about the nature of the problem(s) and counseled on treatment options.  The patient appeared to understand what I have discussed and is in agreement with it.  Encounter Diagnoses  Name Primary?  . Pain of joint of left ankle and foot Yes  . Heel pain, chronic, right     PLAN Call if any problems.  Precautions discussed.  Continue current medications.  Return to clinic prn   I have reviewed the [facility] Controlled Sub[facility] Reporting System web site prior to prescribing narcotic medicine for this patient.  Electronically Signed Darreld McleanWayne Briceyda Abdullah, MD 2/21/20194:00 PM

## 2018-01-14 ENCOUNTER — Ambulatory Visit: Payer: 59 | Admitting: Orthopaedic Surgery

## 2018-08-12 DIAGNOSIS — M797 Fibromyalgia: Secondary | ICD-10-CM | POA: Diagnosis not present

## 2018-08-12 DIAGNOSIS — Z23 Encounter for immunization: Secondary | ICD-10-CM | POA: Diagnosis not present

## 2018-09-10 DIAGNOSIS — I10 Essential (primary) hypertension: Secondary | ICD-10-CM | POA: Diagnosis not present

## 2018-09-10 DIAGNOSIS — M797 Fibromyalgia: Secondary | ICD-10-CM | POA: Diagnosis not present

## 2018-09-27 DIAGNOSIS — M797 Fibromyalgia: Secondary | ICD-10-CM | POA: Diagnosis not present

## 2018-09-27 DIAGNOSIS — I10 Essential (primary) hypertension: Secondary | ICD-10-CM | POA: Diagnosis not present

## 2018-09-29 DIAGNOSIS — M1710 Unilateral primary osteoarthritis, unspecified knee: Secondary | ICD-10-CM | POA: Diagnosis not present

## 2018-09-29 DIAGNOSIS — M797 Fibromyalgia: Secondary | ICD-10-CM | POA: Diagnosis not present

## 2018-09-29 DIAGNOSIS — M461 Sacroiliitis, not elsewhere classified: Secondary | ICD-10-CM | POA: Diagnosis not present

## 2018-09-29 DIAGNOSIS — Z299 Encounter for prophylactic measures, unspecified: Secondary | ICD-10-CM | POA: Diagnosis not present

## 2018-09-29 DIAGNOSIS — M25552 Pain in left hip: Secondary | ICD-10-CM | POA: Diagnosis not present

## 2018-09-29 DIAGNOSIS — Z6841 Body Mass Index (BMI) 40.0 and over, adult: Secondary | ICD-10-CM | POA: Diagnosis not present

## 2018-09-29 DIAGNOSIS — I10 Essential (primary) hypertension: Secondary | ICD-10-CM | POA: Diagnosis not present

## 2018-10-06 ENCOUNTER — Encounter: Payer: Self-pay | Admitting: Orthopaedic Surgery

## 2018-10-06 ENCOUNTER — Ambulatory Visit: Payer: 59 | Admitting: Orthopaedic Surgery

## 2018-10-06 VITALS — BP 120/76 | HR 67 | Ht 68.0 in | Wt 314.0 lb

## 2018-10-06 DIAGNOSIS — M25572 Pain in left ankle and joints of left foot: Secondary | ICD-10-CM

## 2018-10-06 DIAGNOSIS — M79671 Pain in right foot: Secondary | ICD-10-CM

## 2018-10-06 DIAGNOSIS — Z6841 Body Mass Index (BMI) 40.0 and over, adult: Secondary | ICD-10-CM

## 2018-10-06 DIAGNOSIS — G8929 Other chronic pain: Secondary | ICD-10-CM

## 2018-10-06 MED ORDER — HYDROCODONE-ACETAMINOPHEN 5-325 MG PO TABS: ORAL_TABLET | ORAL | 0 refills | Status: DC

## 2018-10-06 NOTE — Progress Notes (Signed)
Patient [name], female DOB:04/28/1971, 47 y.o. WUJ:[id number]  Chief Complaint  Patient presents with  . Foot Pain    Left foot and ankle pain  . Ankle Pain    HPI  Jessica [name] is a 47 y.o. female who has chronic left heel pain.  She has some pain but it is less.  She is doing better. She still has pain after sitting a while but not as bad as it was.  She has been doing the stretching exercises.  She has no new trauma.   Body mass index is 47.74 kg/m.  The patient meets the AMA guidelines for Morbid (severe) obesity with a BMI > 40.0 and I have recommended weight loss.   ROS  Review of Systems  Musculoskeletal: Positive for arthralgias and gait problem.  All other systems reviewed and are negative.   All other systems reviewed and are negative.  The following is a summary of the past history medically, past history surgically, known current medicines, social history and family history.  This information is gathered electronically by the computer from prior information and documentation.  I review this each visit and have found including this information at this point in the chart is beneficial and informative.    Past Medical History:  Diagnosis Date  . Hypertension     Past Surgical History:  Procedure Laterality Date  . ANKLE SURGERY    . CHOLECYSTECTOMY    . TUBAL LIGATION      History reviewed. No pertinent family history.  Social History Social History   Tobacco Use  . Smoking status: Never Smoker  . Smokeless tobacco: Never Used  Substance Use Topics  . Alcohol use: No  . Drug use: No    Allergies  Allergen Reactions  . Sulfa Antibiotics Rash    Current Outpatient Medications  Medication Sig Dispense Refill  . atenolol (TENORMIN) 25 MG tablet Take 25 mg by mouth daily.      . cetirizine (ZYRTEC) 10 MG tablet Take 10 mg by mouth daily.      . diclofenac (VOLTAREN) 75 MG EC tablet TAKE ONE TABLET BY MOUTH TWICE DAILY 60 tablet 5  .  DULoxetine (CYMBALTA) 30 MG capsule Take 30 mg by mouth daily.    Marland Kitchen. HYDROcodone-acetaminophen (NORCO/VICODIN) 5-325 MG tablet One tablet every four hours as needed for acute pain. 36 tablet 0  . norethindrone (MICRONOR,CAMILA,ERRIN) 0.35 MG tablet Take 1 tablet by mouth daily.       No current facility-administered medications for this visit.      Physical Exam  Blood pressure 120/76, pulse 67, height 5\' 8"  (1.727 m), weight (!) 314 lb (142.4 kg).  Constitutional: overall normal hygiene, normal nutrition, well developed, normal grooming, normal body habitus. Assistive device:none  Musculoskeletal: gait and station Limp none, muscle tone and strength are normal, no tremors or atrophy is present.  .  Neurological: coordination overall normal.  Deep tendon reflex/nerve stretch intact.  Sensation normal.  Cranial nerves II-XII intact.   Skin:   Normal overall no scars, lesions, ulcers or rashes. No psoriasis.  Psychiatric: Alert and oriented x 3.  Recent memory intact, remote memory unclear.  Normal mood and affect. Well groomed.  Good eye contact.  Cardiovascular: overall no swelling, no varicosities, no edema bilaterally, normal temperatures of the legs and arms, no clubbing, cyanosis and good capillary refill.  Lymphatic: palpation is normal.  Her left heel is tender on the plantar surface but there is no swelling or  redness.  NV intact. ROM of the ankle is full.  All other systems reviewed and are negative   The patient has been educated about the nature of the problem(s) and counseled on treatment options.  The patient appeared to understand what I have discussed and is in agreement with it.  Encounter Diagnoses  Name Primary?  . Pain of joint of left ankle and foot Yes  . Heel pain, chronic, right   . Body mass index 45.0-49.9, adult (HCC)   . Morbid obesity (HCC)     PLAN Call if any problems.  Precautions discussed.  Continue current medications.   Return to clinic 2  months   I have reviewed the [facility] Controlled Substance Reporting System web site prior to prescribing narcotic medicine for this patient.   Electronically Signed Darreld Mclean, MD 11/20/201910:24 AM

## 2018-11-13 DIAGNOSIS — M5136 Other intervertebral disc degeneration, lumbar region: Secondary | ICD-10-CM | POA: Diagnosis not present

## 2018-11-13 DIAGNOSIS — M545 Low back pain: Secondary | ICD-10-CM | POA: Diagnosis not present

## 2018-11-13 DIAGNOSIS — K219 Gastro-esophageal reflux disease without esophagitis: Secondary | ICD-10-CM | POA: Diagnosis not present

## 2018-11-13 DIAGNOSIS — I10 Essential (primary) hypertension: Secondary | ICD-10-CM | POA: Diagnosis not present

## 2018-11-24 DIAGNOSIS — B373 Candidiasis of vulva and vagina: Secondary | ICD-10-CM | POA: Diagnosis not present

## 2018-11-24 DIAGNOSIS — I10 Essential (primary) hypertension: Secondary | ICD-10-CM | POA: Diagnosis not present

## 2018-12-02 DIAGNOSIS — G4700 Insomnia, unspecified: Secondary | ICD-10-CM | POA: Diagnosis not present

## 2018-12-02 DIAGNOSIS — I10 Essential (primary) hypertension: Secondary | ICD-10-CM | POA: Diagnosis not present

## 2018-12-02 DIAGNOSIS — Z713 Dietary counseling and surveillance: Secondary | ICD-10-CM | POA: Diagnosis not present

## 2018-12-02 DIAGNOSIS — M797 Fibromyalgia: Secondary | ICD-10-CM | POA: Diagnosis not present

## 2018-12-02 DIAGNOSIS — Z299 Encounter for prophylactic measures, unspecified: Secondary | ICD-10-CM | POA: Diagnosis not present

## 2018-12-02 DIAGNOSIS — J069 Acute upper respiratory infection, unspecified: Secondary | ICD-10-CM | POA: Diagnosis not present

## 2018-12-08 ENCOUNTER — Ambulatory Visit: Payer: 59 | Admitting: Orthopaedic Surgery

## 2018-12-22 DIAGNOSIS — M797 Fibromyalgia: Secondary | ICD-10-CM | POA: Diagnosis not present

## 2018-12-22 DIAGNOSIS — M545 Low back pain: Secondary | ICD-10-CM | POA: Diagnosis not present

## 2018-12-22 DIAGNOSIS — I10 Essential (primary) hypertension: Secondary | ICD-10-CM | POA: Diagnosis not present

## 2019-01-12 DIAGNOSIS — Z789 Other specified health status: Secondary | ICD-10-CM | POA: Diagnosis not present

## 2019-01-12 DIAGNOSIS — I10 Essential (primary) hypertension: Secondary | ICD-10-CM | POA: Diagnosis not present

## 2019-01-12 DIAGNOSIS — J329 Chronic sinusitis, unspecified: Secondary | ICD-10-CM | POA: Diagnosis not present

## 2019-01-12 DIAGNOSIS — Z6841 Body Mass Index (BMI) 40.0 and over, adult: Secondary | ICD-10-CM | POA: Diagnosis not present

## 2019-03-07 DIAGNOSIS — I10 Essential (primary) hypertension: Secondary | ICD-10-CM | POA: Diagnosis not present

## 2019-03-07 DIAGNOSIS — M797 Fibromyalgia: Secondary | ICD-10-CM | POA: Diagnosis not present

## 2019-08-04 ENCOUNTER — Ambulatory Visit: Payer: 59 | Admitting: Orthopaedic Surgery

## 2019-08-09 ENCOUNTER — Ambulatory Visit: Payer: 59 | Admitting: Orthopaedic Surgery

## 2019-11-07 ENCOUNTER — Ambulatory Visit (INDEPENDENT_AMBULATORY_CARE_PROVIDER_SITE_OTHER): Payer: 59 | Admitting: Orthopaedic Surgery

## 2019-11-07 ENCOUNTER — Other Ambulatory Visit: Payer: Self-pay

## 2019-11-07 ENCOUNTER — Encounter: Payer: Self-pay | Admitting: Orthopaedic Surgery

## 2019-11-07 VITALS — BP 139/99 | HR 96 | Temp 97.0°F | Ht 68.0 in | Wt 312.2 lb

## 2019-11-07 DIAGNOSIS — G8929 Other chronic pain: Secondary | ICD-10-CM | POA: Diagnosis not present

## 2019-11-07 DIAGNOSIS — Z6841 Body Mass Index (BMI) 40.0 and over, adult: Secondary | ICD-10-CM | POA: Diagnosis not present

## 2019-11-07 DIAGNOSIS — M79671 Pain in right foot: Secondary | ICD-10-CM | POA: Diagnosis not present

## 2019-11-07 MED ORDER — HYDROCODONE-ACETAMINOPHEN 5-325 MG PO TABS: ORAL_TABLET | ORAL | 0 refills | Status: DC

## 2019-11-07 NOTE — Progress Notes (Signed)
Patient Jessica [name], female DOB:July 30, 1971, 48 y.o. LPF:[id number]  Chief Complaint  Patient presents with  . Foot Pain    L/ Hurts    HPI  Jessica [name] is a 48 y.o. female who has recurrent heel pain on the left.  She works at [employer] and has been on her feet a lot recently.  She has been doing the stretching exercises.  She has no redness, no numbness.  It hurts more when she first starts walking.   Body mass index is 47.48 kg/m.  The patient meets the AMA guidelines for Morbid (severe) obesity with a BMI > 40.0 and I have recommended weight loss.   ROS  Review of Systems  Constitutional: Positive for activity change.  Musculoskeletal: Positive for arthralgias and gait problem.  All other systems reviewed and are negative.   All other systems reviewed and are negative.  The following is a summary of the past history medically, past history surgically, known current medicines, social history and family history.  This information is gathered electronically by the computer from prior information and documentation.  I review this each visit and have found including this information at this point in the chart is beneficial and informative.    Past Medical History:  Diagnosis Date  . Hypertension     Past Surgical History:  Procedure Laterality Date  . ANKLE SURGERY    . CHOLECYSTECTOMY    . TUBAL LIGATION      History reviewed. No pertinent family history.  Social History Social History   Tobacco Use  . Smoking status: Never Smoker  . Smokeless tobacco: Never Used  Substance Use Topics  . Alcohol use: No  . Drug use: No    Allergies  Allergen Reactions  . Sulfa Antibiotics Rash    Current Outpatient Medications  Medication Sig Dispense Refill  . atenolol (TENORMIN) 25 MG tablet Take 25 mg by mouth daily.      . cetirizine (ZYRTEC) 10 MG tablet Take 10 mg by mouth daily.      . diclofenac (VOLTAREN) 75 MG EC tablet TAKE ONE TABLET BY MOUTH  TWICE DAILY 60 tablet 5  . DULoxetine (CYMBALTA) 30 MG capsule Take 30 mg by mouth daily.    Marland Kitchen HYDROcodone-acetaminophen (NORCO/VICODIN) 5-325 MG tablet One tablet every four hours as needed for acute pain.  Limit of five days per [place] [facility]. 30 tablet 0  . norethindrone (MICRONOR,CAMILA,ERRIN) 0.35 MG tablet Take 1 tablet by mouth daily.       No current facility-administered medications for this visit.     Physical Exam  Blood pressure (!) 139/99, pulse 96, temperature (!) 97 F (36.1 C), height 5\' 8"  (1.727 m), weight (!) 312 lb 4 oz (141.6 kg).  Constitutional: overall normal hygiene, normal nutrition, well developed, normal grooming, normal body habitus. Assistive device:none  Musculoskeletal: gait and station Limp left, muscle tone and strength are normal, no tremors or atrophy is present.  .  Neurological: coordination overall normal.  Deep tendon reflex/nerve stretch intact.  Sensation normal.  Cranial nerves II-XII intact.   Skin:   Normal overall no scars, lesions, ulcers or rashes. No psoriasis.  Psychiatric: Alert and oriented x 3.  Recent memory intact, remote memory unclear.  Normal mood and affect. Well groomed.  Good eye contact.  Cardiovascular: overall no swelling, no varicosities, no edema bilaterally, normal temperatures of the legs and arms, no clubbing, cyanosis and good capillary refill.  Lymphatic: palpation is normal.  Left  heel is tender on the plantar center area. She has no redness.  NV intact. ROM ankle is full.  All other systems reviewed and are negative   The patient has been educated about the nature of the problem(s) and counseled on treatment options.  The patient appeared to understand what I have discussed and is in agreement with it.  Encounter Diagnoses  Name Primary?  . Heel pain, chronic, right Yes  . Body mass index 45.0-49.9, adult (HCC)   . Morbid obesity (HCC)     PLAN Call if any problems.  Precautions discussed.  Continue  current medications.   Return to clinic 1 month   I have reviewed the [facility] Controlled Substance Reporting System web site prior to prescribing narcotic medicine for this patient.   Electronically Signed Darreld Mclean, MD 12/21/20203:44 PM

## 2019-11-29 ENCOUNTER — Telehealth: Payer: Self-pay | Admitting: Orthopaedic Surgery

## 2019-11-29 MED ORDER — HYDROCODONE-ACETAMINOPHEN 5-325 MG PO TABS: ORAL_TABLET | ORAL | 0 refills | Status: DC

## 2019-11-29 NOTE — Telephone Encounter (Signed)
Patient requests refill on Hydrocodone/Acetaminophen 5-325  Mgs  Qty 30  Sig: One tablet every four hours as needed for acute pain. Limit of five days per [place].  Patient states she uses [facility] in [place]

## 2019-12-06 ENCOUNTER — Ambulatory Visit: Payer: 59 | Admitting: Orthopaedic Surgery

## 2020-01-19 ENCOUNTER — Other Ambulatory Visit: Payer: Self-pay

## 2020-01-19 ENCOUNTER — Ambulatory Visit: Payer: 59

## 2020-01-19 ENCOUNTER — Ambulatory Visit: Payer: 59 | Admitting: Orthopaedic Surgery

## 2020-01-19 ENCOUNTER — Encounter: Payer: Self-pay | Admitting: Orthopaedic Surgery

## 2020-01-19 VITALS — BP 139/80 | HR 80 | Ht 68.0 in | Wt 325.0 lb

## 2020-01-19 DIAGNOSIS — M25561 Pain in right knee: Secondary | ICD-10-CM

## 2020-01-19 DIAGNOSIS — M25571 Pain in right ankle and joints of right foot: Secondary | ICD-10-CM

## 2020-01-19 DIAGNOSIS — W000XXA Fall on same level due to ice and snow, initial encounter: Secondary | ICD-10-CM | POA: Diagnosis not present

## 2020-01-19 MED ORDER — HYDROCODONE-ACETAMINOPHEN 5-325 MG PO TABS: ORAL_TABLET | ORAL | 0 refills | Status: DC

## 2020-01-19 NOTE — Progress Notes (Signed)
Patient Jessica [name]:ERXVQMG KAETLYN NOA, female DOB:1971-08-07, 49 y.o. QQP:[id number]  Chief Complaint  Patient presents with  . Ankle Pain    right   . Knee Pain    right     HPI  Jessica [name] is a 49 y.o. female who slipped and fell on ice about two to three weeks ago.  She hurt her right ankle and right knee.  The ankle is still hurting a lot.  The knee swells and pops but does not give way. She has no redness, no numbness.  She had no other injury.   Body mass index is 49.42 kg/m.  ROS  Review of Systems  Constitutional: Positive for activity change.  Musculoskeletal: Positive for arthralgias and gait problem.  All other systems reviewed and are negative.   All other systems reviewed and are negative.  The following is a summary of the past history medically, past history surgically, known current medicines, social history and family history.  This information is gathered electronically by the computer from prior information and documentation.  I review this each visit and have found including this information at this point in the chart is beneficial and informative.    Past Medical History:  Diagnosis Date  . Hypertension     Past Surgical History:  Procedure Laterality Date  . ANKLE SURGERY    . CHOLECYSTECTOMY    . TUBAL LIGATION      History reviewed. No pertinent family history.  Social History Social History   Tobacco Use  . Smoking status: Never Smoker  . Smokeless tobacco: Never Used  Substance Use Topics  . Alcohol use: No  . Drug use: No    Allergies  Allergen Reactions  . Sulfa Antibiotics Rash    Current Outpatient Medications  Medication Sig Dispense Refill  . atenolol (TENORMIN) 25 MG tablet Take 25 mg by mouth daily.      . cetirizine (ZYRTEC) 10 MG tablet Take 10 mg by mouth daily.      . cyclobenzaprine (FLEXERIL) 10 MG tablet Take 10 mg by mouth 3 (three) times daily as needed for muscle spasms.    . DULoxetine (CYMBALTA) 30 MG capsule  Take 30 mg by mouth daily.    Marland Kitchen HYDROcodone-acetaminophen (NORCO/VICODIN) 5-325 MG tablet One tablet every six hours for pain.  Limit 7 days. 28 tablet 0  . ibuprofen (ADVIL) 800 MG tablet Take 800 mg by mouth every 8 (eight) hours as needed.    . norethindrone (MICRONOR,CAMILA,ERRIN) 0.35 MG tablet Take 1 tablet by mouth daily.      . diclofenac (VOLTAREN) 75 MG EC tablet TAKE ONE TABLET BY MOUTH TWICE DAILY (Patient not taking: Reported on 01/19/2020) 60 tablet 5   No current facility-administered medications for this visit.     Physical Exam  Blood pressure 139/80, pulse 80, height 5\' 8"  (1.727 m), weight (!) 325 lb (147.4 kg).  Constitutional: overall normal hygiene, normal nutrition, well developed, normal grooming, normal body habitus. Assistive device:none  Musculoskeletal: gait and station Limp right, muscle tone and strength are normal, no tremors or atrophy is present.  .  Neurological: coordination overall normal.  Deep tendon reflex/nerve stretch intact.  Sensation normal.  Cranial nerves II-XII intact.   Skin:   Normal overall no scars, lesions, ulcers or rashes. No psoriasis.  Psychiatric: Alert and oriented x 3.  Recent memory intact, remote memory unclear.  Normal mood and affect. Well groomed.  Good eye contact.  Cardiovascular: overall no swelling, no varicosities, no  edema bilaterally, normal temperatures of the legs and arms, no clubbing, cyanosis and good capillary refill.  Lymphatic: palpation is normal.  Right knee with slight effusion, crepitus, ROM 0 to 105, medial pain, stable, NV intact.  Right ankle with diffuse tenderness, more tender medially, slight swelling, NV intact. ROM full but tender.  Limp right.  All other systems reviewed and are negative   The patient has been educated about the nature of the problem(s) and counseled on treatment options.  The patient appeared to understand what I have discussed and is in agreement with it.  Encounter  Diagnoses  Name Primary?  . Acute pain of right knee Yes  . Acute right ankle pain    X-rays were done of the right knee and right ankle, reported separately.  I am concerned about a loose body within the ankle joint.  I would like to get a MRI to rule this out.  PROCEDURE NOTE:  The patient requests injections of the right knee , verbal consent was obtained.  The right knee was prepped appropriately after time out was performed.   Sterile technique was observed and injection of 1 cc of Depo-Medrol 40 mg with several cc's of plain xylocaine. Anesthesia was provided by ethyl chloride and a 20-gauge needle was used to inject the knee area. The injection was tolerated well.  A band aid dressing was applied.  The patient was advised to apply ice later today and tomorrow to the injection sight as needed.  PLAN Call if any problems.  Precautions discussed.  Continue current medications.   Return to clinic 2 weeks   Get MRI.  I have reviewed the [place] Controlled Substance Reporting System web site prior to prescribing narcotic medicine for this patient.   Electronically Signed Darreld Mclean, MD 3/4/20218:57 AM

## 2020-01-19 NOTE — Progress Notes (Signed)
Health History Questions  Smoker? No     Ready to quit? No Diabetic? No      On Insulin? No  Hypertension? No      HTN Meds No Breathing problems or COPD? No  Any recent changes to your health? No

## 2020-01-24 ENCOUNTER — Telehealth: Payer: Self-pay | Admitting: Orthopaedic Surgery

## 2020-01-24 NOTE — Telephone Encounter (Signed)
Jessica [name] would like for you to call her at work when you have a minute please  Phone number is [phone]

## 2020-01-24 NOTE — Telephone Encounter (Signed)
Tranisha wanted to see if she wore a ankle brace(slip on ) would be ok. Stated it felt better with it on, I told her if she felt like it was helping than by all means use it.

## 2020-02-07 ENCOUNTER — Ambulatory Visit: Payer: 59 | Admitting: Orthopaedic Surgery

## 2020-02-18 ENCOUNTER — Ambulatory Visit
Admission: RE | Admit: 2020-02-18 | Discharge: 2020-02-18 | Disposition: A | Discharge status: Home / Self Care | Payer: 59 | Source: Ambulatory Visit | Attending: Orthopaedic Surgery | Admitting: Orthopaedic Surgery

## 2020-02-18 DIAGNOSIS — M25571 Pain in right ankle and joints of right foot: Secondary | ICD-10-CM

## 2020-02-21 ENCOUNTER — Encounter: Payer: Self-pay | Admitting: Orthopaedic Surgery

## 2020-02-21 ENCOUNTER — Ambulatory Visit: Payer: 59 | Admitting: Orthopaedic Surgery

## 2020-02-21 ENCOUNTER — Other Ambulatory Visit: Payer: Self-pay

## 2020-02-21 ENCOUNTER — Telehealth: Payer: Self-pay | Admitting: Orthopaedic Surgery

## 2020-02-21 VITALS — Temp 97.4°F | Ht 68.0 in | Wt 322.0 lb

## 2020-02-21 DIAGNOSIS — Z6841 Body Mass Index (BMI) 40.0 and over, adult: Secondary | ICD-10-CM

## 2020-02-21 DIAGNOSIS — M25571 Pain in right ankle and joints of right foot: Secondary | ICD-10-CM

## 2020-02-21 MED ORDER — HYDROCODONE-ACETAMINOPHEN 5-325 MG PO TABS: ORAL_TABLET | ORAL | 0 refills | Status: DC

## 2020-02-21 NOTE — Telephone Encounter (Signed)
Patient states did not get to ask for her refill at time of visit today, 02/21/20: HYDROcodone-acetaminophen (NORCO/VICODIN) 5-325 MG tablet 28 tablet  -[facility], [place]

## 2020-02-21 NOTE — Progress Notes (Signed)
Patient ZD:GLOVFIE Jessica [name], female DOB:07/25/1971, 49 y.o. PPI:[id number]  Chief Complaint  Patient presents with  . Ankle Pain    Lt ankle     HPI  Jessica [name] is a 49 y.o. female who has ankle pain on the left.  She had MRI done which showed: IMPRESSION: 1. Findings suggests a diffuse inflammatory process with multiple joint effusions and tenosynovitis as detailed above. Findings could be due to a process such as rheumatoid arthritis or this could be posttraumatic synovitis. 2. No fractures or bone lesions. 3. Intact medial and lateral ankle ligaments and medial, lateral and anterior ankle tendons. Tenosynovitis as detailed above. 4. Changes suggest mild plantar fasciitis. 5. 13 mm lesion in the distal soleus muscle laterally is indeterminate on this examination. Recommend follow-up postcontrast imaging as detailed above.  I have explained the findings to her.  I will get a new MRI with the study as recommended for the lesion on the lower leg per above.    Body mass index is 48.96 kg/m.  The patient meets the AMA guidelines for Morbid (severe) obesity with a BMI > 40.0 and I have recommended weight loss.   ROS  Review of Systems  Constitutional: Positive for activity change.  Musculoskeletal: Positive for arthralgias and gait problem.  All other systems reviewed and are negative.   All other systems reviewed and are negative.  The following is a summary of the past history medically, past history surgically, known current medicines, social history and family history.  This information is gathered electronically by the computer from prior information and documentation.  I review this each visit and have found including this information at this point in the chart is beneficial and informative.    Past Medical History:  Diagnosis Date  . Hypertension     Past Surgical History:  Procedure Laterality Date  . ANKLE SURGERY    . CHOLECYSTECTOMY    . TUBAL  LIGATION      No family history on file.  Social History Social History   Tobacco Use  . Smoking status: Never Smoker  . Smokeless tobacco: Never Used  Substance Use Topics  . Alcohol use: No  . Drug use: No    Allergies  Allergen Reactions  . Sulfa Antibiotics Rash    Current Outpatient Medications  Medication Sig Dispense Refill  . atenolol (TENORMIN) 25 MG tablet Take 25 mg by mouth daily.      . cetirizine (ZYRTEC) 10 MG tablet Take 10 mg by mouth daily.      . cyclobenzaprine (FLEXERIL) 10 MG tablet Take 10 mg by mouth 3 (three) times daily as needed for muscle spasms.    . diclofenac (VOLTAREN) 75 MG EC tablet TAKE ONE TABLET BY MOUTH TWICE DAILY (Patient not taking: Reported on 01/19/2020) 60 tablet 5  . DULoxetine (CYMBALTA) 30 MG capsule Take 30 mg by mouth daily.    . DULoxetine (CYMBALTA) 60 MG capsule Take 60 mg by mouth daily.    Marland Kitchen HYDROcodone-acetaminophen (NORCO/VICODIN) 5-325 MG tablet One tablet every six hours for pain.  Limit 7 days. 28 tablet 0  . ibuprofen (ADVIL) 800 MG tablet Take 800 mg by mouth every 8 (eight) hours as needed.    Marland Kitchen levocetirizine (XYZAL) 5 MG tablet TAKE ONE TABLET BY MOUTH TWICE DAILY FOR TWO WEEKS THEN REDUCE TO ONCE DAILY    . norethindrone (MICRONOR,CAMILA,ERRIN) 0.35 MG tablet Take 1 tablet by mouth daily.       No current facility-administered  medications for this visit.     Physical Exam  Temperature (!) 97.4 F (36.3 C), height 5\' 8"  (1.727 m), weight (!) 322 lb (146.1 kg).  Constitutional: overall normal hygiene, normal nutrition, well developed, normal grooming, normal body habitus. Assistive device:none  Musculoskeletal: gait and station Limp left, muscle tone and strength are normal, no tremors or atrophy is present.  .  Neurological: coordination overall normal.  Deep tendon reflex/nerve stretch intact.  Sensation normal.  Cranial nerves II-XII intact.   Skin:   Normal overall no scars, lesions, ulcers or rashes.  No psoriasis.  Psychiatric: Alert and oriented x 3.  Recent memory intact, remote memory unclear.  Normal mood and affect. Well groomed.  Good eye contact.  Cardiovascular: overall no swelling, no varicosities, no edema bilaterally, normal temperatures of the legs and arms, no clubbing, cyanosis and good capillary refill.  Lymphatic: palpation is normal.  She has no swelling of the left ankle or foot today.  She has a limp and pain of distal left lateral leg.  All other systems reviewed and are negative   The patient has been educated about the nature of the problem(s) and counseled on treatment options.  The patient appeared to understand what I have discussed and is in agreement with it.  Encounter Diagnosis  Name Primary?  . Acute right ankle pain Yes    PLAN Call if any problems.  Precautions discussed.  Continue current medications.   Return to clinic 2 weeks   Get the new MRI.  Electronically Signed Sanjuana Kava, MD 4/6/202111:20 AM

## 2020-02-21 NOTE — Telephone Encounter (Signed)
Done

## 2020-02-21 NOTE — Telephone Encounter (Signed)
It was via phone call by patient after she got home from being here at appointment today that she asked about her medication refill.

## 2020-02-22 ENCOUNTER — Other Ambulatory Visit: Payer: Self-pay | Admitting: *Deleted

## 2020-02-22 DIAGNOSIS — M25571 Pain in right ankle and joints of right foot: Secondary | ICD-10-CM

## 2020-03-15 ENCOUNTER — Ambulatory Visit
Admission: RE | Admit: 2020-03-15 | Discharge: 2020-03-15 | Disposition: A | Discharge status: Home / Self Care | Payer: 59 | Source: Ambulatory Visit | Attending: Orthopaedic Surgery | Admitting: Orthopaedic Surgery

## 2020-03-15 DIAGNOSIS — M25571 Pain in right ankle and joints of right foot: Secondary | ICD-10-CM

## 2020-03-15 MED ORDER — GADOBENATE DIMEGLUMINE 529 MG/ML IV SOLN
20.0000 mL | Freq: Once | INTRAVENOUS | Status: AC | PRN
  Administered 2020-03-15: 20 mL via INTRAVENOUS

## 2020-03-19 ENCOUNTER — Other Ambulatory Visit: Payer: 59

## 2020-03-20 ENCOUNTER — Ambulatory Visit (INDEPENDENT_AMBULATORY_CARE_PROVIDER_SITE_OTHER): Payer: 59 | Admitting: Orthopaedic Surgery

## 2020-03-20 ENCOUNTER — Encounter: Payer: Self-pay | Admitting: Orthopaedic Surgery

## 2020-03-20 ENCOUNTER — Other Ambulatory Visit: Payer: Self-pay

## 2020-03-20 VITALS — BP 144/89 | HR 82 | Ht 68.0 in | Wt 339.0 lb

## 2020-03-20 DIAGNOSIS — M7751 Other enthesopathy of right foot: Secondary | ICD-10-CM | POA: Diagnosis not present

## 2020-03-20 DIAGNOSIS — Z6841 Body Mass Index (BMI) 40.0 and over, adult: Secondary | ICD-10-CM

## 2020-03-20 MED ORDER — HYDROCODONE-ACETAMINOPHEN 5-325 MG PO TABS: ORAL_TABLET | ORAL | 0 refills | Status: DC

## 2020-03-20 NOTE — Patient Instructions (Signed)
Use Aspercreme, Biofreeze or Voltaren gel over the counter 2-3 times daily make sure you rub it in well each time you use it.   

## 2020-03-20 NOTE — Progress Notes (Signed)
Patient Jessica [name], female DOB:[birth year], 49 y.o. KKX:[id number]  Chief Complaint  Patient presents with  . Ankle Pain    right  . Results    review MRI    HPI  Jessica [name] is a 49 y.o. female who has chronic pain of the right ankle.  MRI was done again with contrast and it showed: IMPRESSION 1. No evidence of a mass in the soleus muscle. A mass was simulated by the cross-sectional appearance of the distal soleus muscle. 2. Progressive tenosynovitis of the posterior tibialis and flexor digitorum longus tendons. 3. Synovitis of the ankle joint and subtalar joint.  I have explained the findings to her.  She is very tender medially around the medial malleolus posteriorly.   Body mass index is 51.54 kg/m.  The patient meets the AMA guidelines for Morbid (severe) obesity with a BMI > 40.0 and I have recommended weight loss.   ROS  Review of Systems  Constitutional: Positive for activity change.  Musculoskeletal: Positive for arthralgias and gait problem.  All other systems reviewed and are negative.   All other systems reviewed and are negative.  The following is a summary of the past history medically, past history surgically, known current medicines, social history and family history.  This information is gathered electronically by the computer from prior information and documentation.  I review this each visit and have found including this information at this point in the chart is beneficial and informative.    Past Medical History:  Diagnosis Date  . Hypertension     Past Surgical History:  Procedure Laterality Date  . ANKLE SURGERY    . CHOLECYSTECTOMY    . TUBAL LIGATION      History reviewed. No pertinent family history.  Social History Social History   Tobacco Use  . Smoking status: Never Smoker  . Smokeless tobacco: Never Used  Substance Use Topics  . Alcohol use: No  . Drug use: No    Allergies  Allergen Reactions  . Sulfa  Antibiotics Rash    Current Outpatient Medications  Medication Sig Dispense Refill  . atenolol (TENORMIN) 25 MG tablet Take 25 mg by mouth daily.      . cyclobenzaprine (FLEXERIL) 10 MG tablet Take 10 mg by mouth 3 (three) times daily as needed for muscle spasms.    . diclofenac (VOLTAREN) 75 MG EC tablet TAKE ONE TABLET BY MOUTH TWICE DAILY 60 tablet 5  . DULoxetine (CYMBALTA) 30 MG capsule Take 30 mg by mouth daily.    . DULoxetine (CYMBALTA) 60 MG capsule Take 60 mg by mouth daily.    Marland Kitchen HYDROcodone-acetaminophen (NORCO/VICODIN) 5-325 MG tablet One tablet every six hours for pain.  Limit 7 days. 28 tablet 0  . ibuprofen (ADVIL) 800 MG tablet Take 800 mg by mouth every 8 (eight) hours as needed.    Marland Kitchen levocetirizine (XYZAL) 5 MG tablet TAKE ONE TABLET BY MOUTH TWICE DAILY FOR TWO WEEKS THEN REDUCE TO ONCE DAILY    . norethindrone (MICRONOR,CAMILA,ERRIN) 0.35 MG tablet Take 1 tablet by mouth daily.       No current facility-administered medications for this visit.     Physical Exam  Blood pressure (!) 144/89, pulse 82, height 5\' 8"  (1.727 m), weight (!) 339 lb (153.8 kg).  Constitutional: overall normal hygiene, normal nutrition, well developed, normal grooming, normal body habitus. Assistive device:none  Musculoskeletal: gait and station Limp right, muscle tone and strength are normal, no tremors or atrophy is present.   Neurological: coordination overall normal.  Deep tendon reflex/nerve stretch intact.  Sensation normal.  Cranial nerves II-XII intact.   Skin:   Normal overall no scars, lesions, ulcers or rashes. No psoriasis.  Psychiatric: Alert and oriented x 3.  Recent memory intact, remote memory unclear.  Normal mood and affect. Well groomed.  Good eye contact.  Cardiovascular: overall no swelling, no varicosities, no edema bilaterally, normal temperatures of the legs and arms, no clubbing, cyanosis and good capillary refill.  Lymphatic: palpation is normal.  Right ankle  has full motion but tender in the area just posterior to the medial malleolus.  NV intact.  All other systems reviewed and are negative   The patient has been educated about the nature of the problem(s) and counseled on treatment options.  The patient appeared to understand what I have discussed and is in agreement with it.  Encounter Diagnoses  Name Primary?  . Tendinitis of right ankle Yes  . Body mass index 45.0-49.9, adult ([place])   . Morbid obesity ([place])    Procedure note: After permission from the patient and sterile prep, I injected the area just posterior to the medial malleolus in the tendon sheaths by sterile technique tolerated well.  PLAN Call if any problems.  Precautions discussed.  Continue current medications.   Return to clinic 1 month   I have reviewed the [facility] web site prior to prescribing narcotic medicine for this patient.   Electronically Signed Sanjuana Kava, MD 5/4/20218:07 AM

## 2020-04-17 ENCOUNTER — Ambulatory Visit: Payer: 59 | Admitting: Orthopaedic Surgery

## 2020-04-18 ENCOUNTER — Telehealth: Payer: Self-pay | Admitting: Orthopaedic Surgery

## 2020-04-18 NOTE — Telephone Encounter (Signed)
Patient requests refill on Hydrocodone/Acetaminophen 5-325  Mgs.  Qty  28  Sig: One tablet every six hours for pain. Limit 7 days.  Patient states she uses [facility]

## 2020-04-19 NOTE — Telephone Encounter (Signed)
No more narcotics 

## 2020-04-24 ENCOUNTER — Ambulatory Visit: Payer: 59 | Admitting: Orthopaedic Surgery

## 2021-04-17 ENCOUNTER — Other Ambulatory Visit: Payer: Self-pay | Admitting: Orthopaedic Surgery

## 2021-04-17 DIAGNOSIS — M25571 Pain in right ankle and joints of right foot: Secondary | ICD-10-CM

## 2021-04-30 ENCOUNTER — Ambulatory Visit: Payer: 59

## 2021-04-30 ENCOUNTER — Other Ambulatory Visit: Payer: Self-pay

## 2021-04-30 ENCOUNTER — Ambulatory Visit: Payer: 59 | Admitting: Orthopaedic Surgery

## 2021-04-30 ENCOUNTER — Encounter: Payer: Self-pay | Admitting: Orthopaedic Surgery

## 2021-04-30 VITALS — BP 128/86 | HR 111 | Ht 68.0 in | Wt 321.0 lb

## 2021-04-30 DIAGNOSIS — G8929 Other chronic pain: Secondary | ICD-10-CM | POA: Diagnosis not present

## 2021-04-30 DIAGNOSIS — M25561 Pain in right knee: Secondary | ICD-10-CM | POA: Diagnosis not present

## 2021-04-30 NOTE — Progress Notes (Signed)
My right knee really hurts.  She took a misstep three days ago and the knee has gotten more painful on the right.  She heard a loud pop.  She can hardly stand on the knee.  She limps.  She has no numbness, no redness.  She cannot fully extend the knee.  Right knee is painful.  She has crepitus, effusion, ROM 5 to 90 with pain, medial joint line pain, positive medial McMurray.  Limp to the right.  NV intact.  X-rays were done of the right knee, reported separately.  Encounter Diagnosis  Name Primary?   Chronic pain of right knee Yes   I am concerned about meniscus tear of the right knee.  She declines MRI.  PROCEDURE NOTE:  The patient requests injections of the right knee , verbal consent was obtained.  The right knee was prepped appropriately after time out was performed.   Sterile technique was observed and injection of 1 cc of DepoMedrol 40mg  with several cc's of plain xylocaine. Anesthesia was provided by ethyl chloride and a 20-gauge needle was used to inject the knee area. The injection was tolerated well.  A band aid dressing was applied.  The patient was advised to apply ice later today and tomorrow to the injection sight as needed.  Stay out of work.  Return in one week.  I will give knee brace.  Call if any problem.  Precautions discussed.  Electronically Signed , MD 6/14/20222:46 PM

## 2021-04-30 NOTE — Patient Instructions (Signed)
Out of work 

## 2021-05-07 ENCOUNTER — Other Ambulatory Visit: Payer: Self-pay

## 2021-05-07 ENCOUNTER — Ambulatory Visit: Payer: 59 | Admitting: Orthopaedic Surgery

## 2021-05-07 ENCOUNTER — Encounter: Payer: Self-pay | Admitting: Orthopaedic Surgery

## 2021-05-07 VITALS — BP 132/95 | HR 100 | Ht 68.0 in | Wt 321.0 lb

## 2021-05-07 DIAGNOSIS — Z6841 Body Mass Index (BMI) 40.0 and over, adult: Secondary | ICD-10-CM

## 2021-05-07 DIAGNOSIS — G8929 Other chronic pain: Secondary | ICD-10-CM

## 2021-05-07 DIAGNOSIS — M25561 Pain in right knee: Secondary | ICD-10-CM | POA: Diagnosis not present

## 2021-05-07 MED ORDER — HYDROCODONE-ACETAMINOPHEN 5-325 MG PO TABS: ORAL_TABLET | ORAL | 0 refills | Status: DC

## 2021-05-07 NOTE — Progress Notes (Signed)
My knee is better but still hurts  Her right knee is painful at times. She has swelling and popping.  She has no new trauma.  She has medial joint line pain.  The injection helped.  She declines MRI.  ROM is 0 to 105, pain medially, limp right, positive medial McMurray.  No distal edema.  NV intact.  Encounter Diagnoses  Name Primary?   Chronic pain of right knee Yes   Morbid obesity (HCC)    Body mass index 45.0-49.9, adult (HCC)    Return in three weeks.  Call if any problem.  Precautions discussed.  Electronically Signed Darreld Mclean, MD 6/21/20223:18 PM

## 2021-05-09 ENCOUNTER — Telehealth: Payer: Self-pay | Admitting: Orthopaedic Surgery

## 2021-05-09 NOTE — Telephone Encounter (Signed)
Spoke with pt who states she went back to work yesterday and is worried she may be causing more injury to knee if something is wrong in there. States her swelling didn't get any worse but having significant pain. Would like to go ahead and get MRI if possible. Pt also states that it was very expensive getting MRI's last go round (Got ankle w/o, then needed MRI W & W/O) , wants to get everything done upfront this time if you think it will be needed. Pt also states she would like to have an open unit at [facility] to have images done.

## 2021-05-09 NOTE — Telephone Encounter (Signed)
Patient called following her last visit of 05/07/21; states she has been re-thinking about MRI, which she declined at time of visit. States while back at work, her knee is bothering her enough that she would like to proceed with MRI. York Spaniel wants only one, not two ("one with dye and one without dye").  Or does patient need to discuss at her next visit with Dr Hilda Lias before proceeding with MRI?

## 2021-05-13 ENCOUNTER — Telehealth: Payer: Self-pay | Admitting: Orthopaedic Surgery

## 2021-05-13 NOTE — Telephone Encounter (Signed)
Jessica [name] called and stated that she tried to go back to work on Friday but just could not do it.  She wants to know if you will give her an out of work note beginning on Friday?

## 2021-05-14 ENCOUNTER — Encounter: Payer: Self-pay | Admitting: Orthopaedic Surgery

## 2021-05-22 ENCOUNTER — Telehealth: Payer: Self-pay | Admitting: Orthopaedic Surgery

## 2021-05-22 DIAGNOSIS — M25561 Pain in right knee: Secondary | ICD-10-CM

## 2021-05-22 DIAGNOSIS — G8929 Other chronic pain: Secondary | ICD-10-CM

## 2021-05-22 NOTE — Telephone Encounter (Signed)
Patient called and states she has called several times and she has spoke to Angie and she told her she needed to talk with [name], and [name] has not returned her call and she knows Dr. Hilda Lias is not in the office this week.    Her questions are  She can't go back to work and she has a note for that (she says it is in our office)          2. She wants to know about a MRI.    She doesn't want to waste all her time off and nothing is being done.   Please call her back at [phone]

## 2021-05-28 ENCOUNTER — Ambulatory Visit: Payer: 59 | Admitting: Orthopaedic Surgery

## 2021-05-30 ENCOUNTER — Encounter: Payer: Self-pay | Admitting: Orthopaedic Surgery

## 2021-06-04 ENCOUNTER — Other Ambulatory Visit: Payer: Self-pay

## 2021-06-04 ENCOUNTER — Ambulatory Visit
Admission: RE | Admit: 2021-06-04 | Discharge: 2021-06-04 | Disposition: A | Discharge status: Home / Self Care | Payer: 59 | Source: Ambulatory Visit | Attending: Orthopaedic Surgery | Admitting: Orthopaedic Surgery

## 2021-06-04 DIAGNOSIS — G8929 Other chronic pain: Secondary | ICD-10-CM

## 2021-06-04 DIAGNOSIS — M25561 Pain in right knee: Secondary | ICD-10-CM

## 2021-06-06 ENCOUNTER — Ambulatory Visit: Payer: 59 | Admitting: Orthopaedic Surgery

## 2021-06-06 ENCOUNTER — Encounter: Payer: Self-pay | Admitting: Orthopaedic Surgery

## 2021-06-06 ENCOUNTER — Other Ambulatory Visit: Payer: Self-pay

## 2021-06-06 VITALS — BP 144/83 | HR 84 | Ht 68.0 in | Wt 330.2 lb

## 2021-06-06 DIAGNOSIS — M23203 Derangement of unspecified medial meniscus due to old tear or injury, right knee: Secondary | ICD-10-CM

## 2021-06-06 DIAGNOSIS — M25561 Pain in right knee: Secondary | ICD-10-CM

## 2021-06-06 DIAGNOSIS — Z6841 Body Mass Index (BMI) 40.0 and over, adult: Secondary | ICD-10-CM | POA: Diagnosis not present

## 2021-06-06 DIAGNOSIS — G8929 Other chronic pain: Secondary | ICD-10-CM | POA: Diagnosis not present

## 2021-06-06 MED ORDER — HYDROCODONE-ACETAMINOPHEN 5-325 MG PO TABS: ORAL_TABLET | ORAL | 0 refills | Status: DC

## 2021-06-06 NOTE — Progress Notes (Signed)
My knee hurts more.  She has pain in the right knee with giving way, crepitus and effusion.  She had MRI of the knee showing:  IMPRESSION: 1. Radial tear of the posterior horn of the medial meniscus with peripheral meniscal extrusion. Degeneration of the body of the medial meniscus. 2. Tricompartmental cartilage abnormalities as described above most severe in the medial femorotibial compartment.     I have explained the findings to her. I will have her see Dr. Romeo Apple or Dr. Dallas Schimke for possible arthroscopy. She does have marked DJD changes of the knee and I have explained this to her as well.  She uses a cane. ROM 0 to 100 with medial pain, positive medial McMurray, crepitus, effusion.  NV intact.  Encounter Diagnoses  Name Primary?   Other old tear of medial meniscus of right knee Yes   Chronic pain of right knee    Morbid obesity (HCC)    Body mass index 45.0-49.9, adult (HCC)    I have reviewed the [place] Controlled Substance Reporting System web site prior to prescribing narcotic medicine for this patient.  Call if any problem.  Precautions discussed.  Electronically Signed Darreld Mclean, MD 7/21/20228:30 AM

## 2021-06-06 NOTE — Patient Instructions (Signed)
Return to clinic to see Dr. Harrison or Dr. Cairns for surgery consultation. 

## 2021-06-07 ENCOUNTER — Telehealth: Payer: Self-pay | Admitting: Orthopaedic Surgery

## 2021-06-07 NOTE — Telephone Encounter (Signed)
At time of patient's office visit 06/06/21, patient presented with forms to be completed per [employer] process, which she is aware.  Patient also has a 3rd form, for [employer], which we discussed that our office will fill out this one-page form in-house. Patient thanked [place].

## 2021-06-07 NOTE — Telephone Encounter (Signed)
Done

## 2021-06-17 ENCOUNTER — Other Ambulatory Visit: Payer: Self-pay

## 2021-06-17 ENCOUNTER — Ambulatory Visit (INDEPENDENT_AMBULATORY_CARE_PROVIDER_SITE_OTHER): Payer: 59 | Admitting: Orthopedic Surgery

## 2021-06-17 ENCOUNTER — Encounter: Payer: Self-pay | Admitting: Orthopedic Surgery

## 2021-06-17 VITALS — BP 161/96 | HR 85 | Ht 68.0 in | Wt 329.5 lb

## 2021-06-17 DIAGNOSIS — G8929 Other chronic pain: Secondary | ICD-10-CM | POA: Diagnosis not present

## 2021-06-17 DIAGNOSIS — Z6841 Body Mass Index (BMI) 40.0 and over, adult: Secondary | ICD-10-CM | POA: Diagnosis not present

## 2021-06-17 DIAGNOSIS — M23203 Derangement of unspecified medial meniscus due to old tear or injury, right knee: Secondary | ICD-10-CM

## 2021-06-17 DIAGNOSIS — M25561 Pain in right knee: Secondary | ICD-10-CM

## 2021-06-17 NOTE — Patient Instructions (Addendum)
Your surgery will be at [facility] by Dr Harrison  The hospital will contact you with a preoperative appointment to discuss Anesthesia. Please bring your medications with you for the appointment. They will tell you the arrival time and medication instructions when you have your preoperative evaluation. Do not wear nail polish the day of your surgery and if you take Phentermine you need to stop this medication ONE WEEK prior to your surgery.  Meniscus Injury, Arthroscopy   Arthroscopy is a surgical procedure that involves the use of a small scope that has a camera and surgical instruments on the end (arthroscope). An arthroscope can be used to repair your meniscus injury.  LET YOUR HEALTH CARE PROVIDER KNOW ABOUT:  Any allergies you have.  All medicines you are taking, including vitamins, herbs, eyedrops, creams, and over-the-counter medicines.  Any recent colds or infections you have had or currently have.  Previous problems you or members of your family have had with the use of anesthetics.  Any blood disorders or blood clotting problems you have.  Previous surgeries you have had.  Medical conditions you have. RISKS AND COMPLICATIONS Generally, this is a safe procedure. However, as with any procedure, problems can occur. Possible problems include:  Damage to nerves or blood vessels.  Excess bleeding.  Blood clots.  Infection. BEFORE THE PROCEDURE  Do not eat or drink for 6-8 hours before the procedure.  Take medicines as directed by your surgeon. Ask your surgeon about changing or stopping your regular medicines.  You may have lab tests the morning of surgery. PROCEDURE  You will be given one of the following:   A medicine that numbs the area (local anesthesia).  A medicine that makes you go to sleep (general anesthesia).  A medicine injected into your spine that numbs your body below the waist (spinal anesthesia). Most often, several small cuts (incisions) are made  in the knee. The arthroscope and instruments go into the incisions to repair the damage. The torn portion of the meniscus is removed.   AFTER THE PROCEDURE  You will be taken to the recovery area where your progress will be monitored. When you are awake, stable, and taking fluids without complications, you will be allowed to go home. This is usually the same day. A torn or stretched ligament (ligament sprain) may take 6-8 weeks to heal.   It takes about the 4-6 WEEKS if your surgeon removed a torn meniscus.  A repaired meniscus may require 6-12 weeks of recovery time.  A torn ligament needing reconstructive surgery may take 6-12 months to heal fully.   This information is not intended to replace advice given to you by your health care provider. Make sure you discuss any questions you have with your health care provider. You have decided to proceed with operative arthroscopy of the knee. You have decided not to continue with nonoperative measures such as but not limited to oral medication, weight loss, activity modification, physical therapy, bracing, or injection.  We will perform operative arthroscopy of the knee. Some of the risks associated with arthroscopic surgery of the knee include but are not limited to Bleeding Infection Swelling Stiffness Blood clot Pain Need for knee replacement surgery    In compliance with recent [place] law in federal regulation regarding opioid use and abuse and addiction, we will taper (stop) opioid medication after 2 weeks.  If you're not comfortable with these risks and would like to continue with nonoperative treatment please let Dr. Harrison know   prior to your surgery. 

## 2021-06-17 NOTE — Progress Notes (Signed)
Established patient new problem   Chief Complaint  Patient presents with   Knee Pain    R/hurting really bad. Here to discuss surgery.    50 year old female referred to me by Dr. Hilda Lias patient has had injections and oral medication for her right knee pain  She did have an injury to the right knee about 6 weeks ago which may or may not be the instigator of her problems.  However, she has medial knee pain she is requiring support for ambulation she has pain when she flexes and extends the knee especially flexing  Her MRI is positive for torn meniscus with extensive arthritis  She is 50 years old and she is BMI 50   Body mass index is 50.1 kg/m.  BP (!) 161/96   Pulse 85   Ht 5\' 8"  (1.727 m)   Wt (!) 329 lb 8 oz (149.5 kg)   BMI 50.10 kg/m   Past Medical History:  Diagnosis Date   Hypertension    Past Surgical History:  Procedure Laterality Date   ANKLE SURGERY     CHOLECYSTECTOMY     TUBAL LIGATION     No family history on file. Social History   Tobacco Use   Smoking status: Never   Smokeless tobacco: Never  Substance Use Topics   Alcohol use: No   Drug use: No     Physical Exam  BP (!) 161/96   Pulse 85   Ht 5\' 8"  (1.727 m)   Wt (!) 329 lb 8 oz (149.5 kg)   BMI 50.10 kg/m    Constitutional: Development normal, nutrition normal, body habitus Body mass index is 50.1 kg/m.  Mental status oriented x3 mood and affect no depression Cardiovascular pulses and temperature normal   Gait crutch supported ambulation  Musculoskeletal:  Inspection: Medial joint line tenderness medial femoral condyle medial joint line and Range of motion: 0-1 20 Stability: ACL PCL collateral ligaments stable Muscle strength and tone: Normal  Skin warm dry and intact no erythema  Neurological sensation normal  Assessment and plan:  The procedure has been fully reviewed with the patient; The risks and benefits of surgery have been discussed and explained and understood.  Alternative treatment has also been reviewed, questions were encouraged and answered. The postoperative plan is also been reviewed.  Patient have an arthroscopy of the right knee and medial meniscectomy she is aware that this will probably not fix her knee problems  She also needs to lose weight  She is probably a candidate for knee replacement if her weight was not so prohibitive  Plan is for arthroscopy right knee partial medial meniscectomy patient understands to have postop pain may not be able to return to work

## 2021-06-19 ENCOUNTER — Telehealth: Payer: Self-pay | Admitting: Radiology

## 2021-06-19 NOTE — Telephone Encounter (Signed)
Surgery planned for Aug 16th  PENDED  Reason: 1.Disposition pending review Tracking #: [id number]

## 2021-06-24 NOTE — Patient Instructions (Addendum)
Your procedure is scheduled on: 07/02/2021   Report to [facility] Entr[facility] at    10:30 AM.   Call this number if you have problems the morning of surgery: [phone]   Remember:   Do not Eat or Drink after midnight         No Smoking the morning of surgery  :  Take these medicines the morning of surgery with A SIP OF WATER: omeprazole, hydrocodone (if needed), robaxin or flexeril (if needed)                  Do not wear jewelry, make-up or nail polish.  Do not wear lotions, powders, or perfumes. You may wear deodorant.  Do not shave 48 hours prior to surgery. Men may shave face and neck.  Do not bring valuables to the hospital.  Contacts, dentures or bridgework may not be worn into surgery.  Leave suitcase in the car. After surgery it may be brought to your room.  For patients admitted to the hospital, checkout time is 11:00 AM the day of discharge.   Patients discharged the day of surgery will not be allowed to drive home and will need someone with them for 24 hours.     Special Instructions: Shower using CHG night before surgery and shower the day of surgery use CHG.  Use special wash - you have one bottle of CHG for all showers.  You should use approximately 1/2 of the bottle for each shower.    Knee Arthroscopy, Care After  This sheet gives you information about how to care for yourself after your procedure. Your health care provider may also give you more specific instructions. If you have problems or questions, contact your health careprovider. What can I expect after the procedure? After the procedure, it is common to have: Soreness. Swelling. Pain. A small amount of fluid from the incisions. Follow these instructions at home: Medicines Take over-the-counter and prescription medicines only as told by your health care provider. Ask your health care provider if the medicine prescribed to you: Requires you to avoid driving or using machinery. Can cause  constipation. You may need to take these actions to prevent or treat constipation: Drink enough fluid to keep your urine pale yellow. Take over-the-counter or prescription medicines. Eat foods that are high in fiber, such as beans, whole grains, and fresh fruits and vegetables. Limit foods that are high in fat and processed sugars, such as fried or sweet foods. If you have a brace or immobilizer: Wear it as told by your health care provider. Remove it only as told by your health care provider. Loosen it if your toes tingle, become numb, or turn cold and blue. Keep it clean and dry. Bathing Do not take baths, swim, or use a hot tub until your health care provider approves. Ask your health care provider if you may take showers. Keep your bandage (dressing) dry until your health care provider says that it can be removed. If the brace or immobilizer is not waterproof: Do not let it get wet. Cover it with a watertight covering when you take a bath or shower. Incision care  Follow instructions from your health care provider about how to take care of your incisions. Make sure you: Wash your hands with soap and water for at least 20 seconds before and after you change your dressing. If soap and water are not available, use hand sanitizer. Change your dressing as told by your health care  provider. Leave stitches (sutures) or adhesive strips in place. These skin closures may need to stay in place for 2 weeks or longer. If adhesive strip edges start to loosen and curl up, you may trim the loose edges. Do not remove adhesive strips completely unless your health care provider tells you to do that. Check your incision areas every day for signs of infection. Check for: Redness. More swelling or pain. Blood or more fluid. Warmth. Pus or a bad smell.  Managing pain, stiffness, and swelling  If directed, put ice on the injured area. To do this: If you have a removable brace or immobilizer, remove it as  told by your health care provider. Put ice in a plastic bag or use the icing device (cold therapy unit) that you were given. Follow instructions about how to use the icing device. Place a towel between your skin and the bag or between your skin and the icing device. Leave the ice on for 20 minutes, 2-3 times a day. Remove the ice if your skin turns bright red. This is very important. If you cannot feel pain, heat, or cold, you have a greater risk of damage to the area. Move your toes often to reduce stiffness and swelling. Raise (elevate) the injured area above the level of your heart while you are sitting or lying down.  Activity Do not use your knee to support your body weight until your health care provider says that you can. Follow weight-bearing restrictions as told. Use crutches or other devices to help you move around (assistive devices) as told by your health care provider. Ask your health care provider what activities are safe for you during recovery, and what activities you need to avoid. If physical therapy was prescribed, do exercises as told by your health care provider. Doing exercises may help improve knee movement, range of motion, and flexibility. Do not lift anything that is heavier than 10 lb (4.5 kg), or the limit that you are told, until your health care provider says that it is safe. General instructions Do not drive until your health care provider approves. You may be able to drive after 1-3 weeks. Do not use any products that contain nicotine or tobacco, such as cigarettes, e-cigarettes, and chewing tobacco. These can delay incision or bone healing after surgery. If you need help quitting, ask your health care provider. Wear compression stockings as told by your health care provider. These stockings help to prevent blood clots and reduce swelling in your legs. Keep all follow-up visits. This is important. Contact a health care provider if: You have any of these signs of  infection: Redness or more pain around an incision. Blood or more fluid coming from an incision. Warmth coming from an incision. Pus or a bad smell coming from an incision. More swelling in your knee. A fever or chills. You have severe knee pain, and medicine does not help. An incision opens up. Get help right away if: You have trouble breathing or shortness of breath. You have chest pain. You develop pain or swelling in your lower leg or at the back of your knee. You have numbness or tingling in your lower leg or your foot. You notice that your foot or toes look darker than normal or are cooler than normal. These symptoms may represent a serious problem that is an emergency. Do not wait to see if the symptoms will go away. Get medical help right away. Call your local emergency services (911 in the  [place] Do not drive yourself to the hospital. Summary To help relieve pain and swelling, put ice on the injured area for 20 minutes, 2-3 times a day. Raise (elevate) the injured area above the level of your heart while you are sitting or lying down. If physical therapy was prescribed, do exercises as told by your health care provider. Exercises may help improve range of motion. This information is not intended to replace advice given to you by your health care provider. Make sure you discuss any questions you have with your healthcare provider. Document Revised: 03/05/2020 Document Reviewed: 03/05/2020 Elsevier Patient Education  2022 [employer] General Anesthesia, Adult, Care After This sheet gives you information about how to care for yourself after your procedure. Your health care provider may also give you more specific instructions. If you have problems or questions, contact your health careprovider. What can I expect after the procedure? After the procedure, the following side effects are common: Pain or discomfort at the IV site. Nausea. Vomiting. Sore throat. Trouble  concentrating. Feeling cold or chills. Feeling weak or tired. Sleepiness and fatigue. Soreness and body aches. These side effects can affect parts of the body that were not involved in surgery. Follow these instructions at home: For the time period you were told by your health care provider:  Rest. Do not participate in activities where you could fall or become injured. Do not drive or use machinery. Do not drink alcohol. Do not take sleeping pills or medicines that cause drowsiness. Do not make important decisions or sign legal documents. Do not take care of children on your own.  Eating and drinking Follow any instructions from your health care provider about eating or drinking restrictions. When you feel hungry, start by eating small amounts of foods that are soft and easy to digest (bland), such as toast. Gradually return to your regular diet. Drink enough fluid to keep your urine pale yellow. If you vomit, rehydrate by drinking water, juice, or clear broth. General instructions If you have sleep apnea, surgery and certain medicines can increase your risk for breathing problems. Follow instructions from your health care provider about wearing your sleep device: Anytime you are sleeping, including during daytime naps. While taking prescription pain medicines, sleeping medicines, or medicines that make you drowsy. Have a responsible adult stay with you for the time you are told. It is important to have someone help care for you until you are awake and alert. Return to your normal activities as told by your health care provider. Ask your health care provider what activities are safe for you. Take over-the-counter and prescription medicines only as told by your health care provider. If you smoke, do not smoke without supervision. Keep all follow-up visits as told by your health care provider. This is important. Contact a health care provider if: You have nausea or vomiting that does  not get better with medicine. You cannot eat or drink without vomiting. You have pain that does not get better with medicine. You are unable to pass urine. You develop a skin rash. You have a fever. You have redness around your IV site that gets worse. Get help right away if: You have difficulty breathing. You have chest pain. You have blood in your urine or stool, or you vomit blood. Summary After the procedure, it is common to have a sore throat or nausea. It is also common to feel tired. Have a responsible adult stay with you for the time you are told.  It is important to have someone help care for you until you are awake and alert. When you feel hungry, start by eating small amounts of foods that are soft and easy to digest (bland), such as toast. Gradually return to your regular diet. Drink enough fluid to keep your urine pale yellow. Return to your normal activities as told by your health care provider. Ask your health care provider what activities are safe for you. This information is not intended to replace advice given to you by your health care provider. Make sure you discuss any questions you have with your healthcare provider. Document Revised: 07/19/2020 Document Reviewed: 02/16/2020 Elsevier Patient Education  2022 [employer].

## 2021-06-25 ENCOUNTER — Other Ambulatory Visit: Payer: Self-pay | Admitting: Orthopedic Surgery

## 2021-06-25 DIAGNOSIS — M23203 Derangement of unspecified medial meniscus due to old tear or injury, right knee: Secondary | ICD-10-CM

## 2021-06-28 ENCOUNTER — Encounter (HOSPITAL_COMMUNITY)
Admission: RE | Admit: 2021-06-28 | Discharge: 2021-06-28 | Disposition: A | Discharge status: Home / Self Care | Payer: 59 | Source: Ambulatory Visit | Attending: Orthopedic Surgery | Admitting: Orthopedic Surgery

## 2021-06-28 ENCOUNTER — Encounter (HOSPITAL_COMMUNITY): Payer: Self-pay

## 2021-06-28 ENCOUNTER — Other Ambulatory Visit: Payer: Self-pay

## 2021-06-28 DIAGNOSIS — Z01818 Encounter for other preprocedural examination: Secondary | ICD-10-CM | POA: Insufficient documentation

## 2021-06-28 HISTORY — DX: Other specified postprocedural states: R11.2

## 2021-06-28 HISTORY — DX: Nausea with vomiting, unspecified: Z98.890

## 2021-06-28 LAB — CBC WITH DIFFERENTIAL/PLATELET
Abs Immature Granulocytes: 0.04 10*3/uL (ref 0.00–0.07)
Basophils Absolute: 0.1 10*3/uL (ref 0.0–0.1)
Basophils Relative: 1 %
Eosinophils Absolute: 0.2 10*3/uL (ref 0.0–0.5)
Eosinophils Relative: 4 %
HCT: 45.2 % (ref 36.0–46.0)
Hemoglobin: 15.2 g/dL — ABNORMAL HIGH (ref 12.0–15.0)
Immature Granulocytes: 1 %
Lymphocytes Relative: 25 %
Lymphs Abs: 1.5 10*3/uL (ref 0.7–4.0)
MCH: 30.9 pg (ref 26.0–34.0)
MCHC: 33.6 g/dL (ref 30.0–36.0)
MCV: 91.9 fL (ref 80.0–100.0)
Monocytes Absolute: 0.4 10*3/uL (ref 0.1–1.0)
Monocytes Relative: 7 %
Neutro Abs: 3.9 10*3/uL (ref 1.7–7.7)
Neutrophils Relative %: 62 %
Platelets: 237 10*3/uL (ref 150–400)
RBC: 4.92 MIL/uL (ref 3.87–5.11)
RDW: 13 % (ref 11.5–15.5)
WBC: 6.1 10*3/uL (ref 4.0–10.5)
nRBC: 0 % (ref 0.0–0.2)

## 2021-06-28 LAB — BASIC METABOLIC PANEL
Anion gap: 9 (ref 5–15)
BUN: 18 mg/dL (ref 6–20)
CO2: 22 mmol/L (ref 22–32)
Calcium: 9.7 mg/dL (ref 8.9–10.3)
Chloride: 105 mmol/L (ref 98–111)
Creatinine, Ser: 1.1 mg/dL — ABNORMAL HIGH (ref 0.44–1.00)
GFR, Estimated: >60 mL/min (ref >60)
Glucose, Bld: 111 mg/dL — ABNORMAL HIGH (ref 70–99)
Potassium: 3.9 mmol/L (ref 3.5–5.1)
Sodium: 136 mmol/L (ref 135–145)

## 2021-07-01 NOTE — H&P (Signed)
History and physical for outpatient surgery   Chief Complaint  Patient presents with   Knee Pain      R/hurting really bad. Here to discuss surgery.      50 year old female referred to me by Dr. Hilda Lias patient has had injections and oral medication for her right knee pain   She did have an injury to the right knee about 6 weeks ago which may or may not be the instigator of her problems.  However, she has medial knee pain she is requiring support for ambulation she has pain when she flexes and extends the knee especially flexing   Her MRI is positive for torn meniscus with extensive arthritis   She is 50 years old and she is BMI 50     Body mass index is 50.1 kg/m.   BP (!) 161/96   Pulse 85   Ht 5\' 8"  (1.727 m)   Wt (!) 329 lb 8 oz (149.5 kg)   BMI 50.10 kg/m        Past Medical History:  Diagnosis Date   Hypertension           Past Surgical History:  Procedure Laterality Date   ANKLE SURGERY       CHOLECYSTECTOMY       TUBAL LIGATION        No family history on file. Social History        Tobacco Use   Smoking status: Never   Smokeless tobacco: Never  Substance Use Topics   Alcohol use: No   Drug use: No        Physical Exam   BP (!) 161/96   Pulse 85   Ht 5\' 8"  (1.727 m)   Wt (!) 329 lb 8 oz (149.5 kg)   BMI 50.10 kg/m      Constitutional: Development normal, nutrition normal, body habitus Body mass index is 50.1 kg/m.   Mental status oriented x3 mood and affect no depression Cardiovascular pulses and temperature normal    Gait crutch supported ambulation   Musculoskeletal:  Inspection: Medial joint line tenderness medial femoral condyle medial joint line and Range of motion: 0-1 20 Stability: ACL PCL collateral ligaments stable Muscle strength and tone: Normal   Skin warm dry and intact no erythema   Neurological sensation normal   Assessment and plan:   The procedure has been fully reviewed with the patient; The risks and  benefits of surgery have been discussed and explained and understood. Alternative treatment has also been reviewed, questions were encouraged and answered. The postoperative plan is also been reviewed.   Patient have an arthroscopy of the right knee and medial meniscectomy she is aware that this will probably not fix her knee problems  She also needs to lose weight  She is probably a candidate for knee replacement if her weight was not so prohibitive  Plan is for arthroscopy right knee partial medial meniscectomy patient understands to have postop pain may not be able to return to work

## 2021-07-02 ENCOUNTER — Encounter (HOSPITAL_COMMUNITY): Payer: Self-pay | Admitting: Orthopedic Surgery

## 2021-07-02 ENCOUNTER — Ambulatory Visit (HOSPITAL_COMMUNITY): Payer: 59 | Admitting: Anesthesiology

## 2021-07-02 ENCOUNTER — Encounter (HOSPITAL_COMMUNITY)
Admission: RE | Disposition: A | Discharge status: Home / Self Care | Payer: Self-pay | Source: Home / Self Care | Attending: Orthopedic Surgery

## 2021-07-02 ENCOUNTER — Ambulatory Visit (HOSPITAL_COMMUNITY)
Admission: RE | Admit: 2021-07-02 | Discharge: 2021-07-02 | Disposition: A | Discharge status: Home / Self Care | Payer: 59 | Attending: Orthopedic Surgery | Admitting: Orthopedic Surgery

## 2021-07-02 ENCOUNTER — Encounter: Payer: Self-pay | Admitting: Orthopedic Surgery

## 2021-07-02 DIAGNOSIS — S83241D Other tear of medial meniscus, current injury, right knee, subsequent encounter: Secondary | ICD-10-CM | POA: Diagnosis not present

## 2021-07-02 DIAGNOSIS — S83241A Other tear of medial meniscus, current injury, right knee, initial encounter: Secondary | ICD-10-CM | POA: Diagnosis not present

## 2021-07-02 DIAGNOSIS — X58XXXA Exposure to other specified factors, initial encounter: Secondary | ICD-10-CM | POA: Insufficient documentation

## 2021-07-02 DIAGNOSIS — Z9049 Acquired absence of other specified parts of digestive tract: Secondary | ICD-10-CM | POA: Insufficient documentation

## 2021-07-02 DIAGNOSIS — I10 Essential (primary) hypertension: Secondary | ICD-10-CM | POA: Insufficient documentation

## 2021-07-02 HISTORY — PX: KNEE ARTHROSCOPY WITH MEDIAL MENISECTOMY: SHX5651

## 2021-07-02 SURGERY — ARTHROSCOPY, KNEE, WITH MEDIAL MENISCECTOMY
Anesthesia: General | Site: Knee | Laterality: Right

## 2021-07-02 MED ORDER — CEFAZOLIN IN SODIUM CHLORIDE 3-0.9 GM/100ML-% IV SOLN
INTRAVENOUS | Status: AC
  Filled 2021-07-02: qty 100

## 2021-07-02 MED ORDER — LIDOCAINE 2% (20 MG/ML) 5 ML SYRINGE
INTRAMUSCULAR | Status: DC | PRN
  Administered 2021-07-02: 100 mg via INTRAVENOUS

## 2021-07-02 MED ORDER — LACTATED RINGERS IV SOLN
INTRAVENOUS | Status: DC
  Administered 2021-07-02: 1000 mL via INTRAVENOUS

## 2021-07-02 MED ORDER — FENTANYL CITRATE (PF) 100 MCG/2ML IJ SOLN
INTRAMUSCULAR | Status: DC | PRN
  Administered 2021-07-02 (×3): 50 ug via INTRAVENOUS

## 2021-07-02 MED ORDER — EPINEPHRINE PF 1 MG/ML IJ SOLN
INTRAMUSCULAR | Status: AC
  Filled 2021-07-02: qty 6

## 2021-07-02 MED ORDER — CEFAZOLIN IN SODIUM CHLORIDE 3-0.9 GM/100ML-% IV SOLN: 3.0000 g | INTRAVENOUS | Status: DC

## 2021-07-02 MED ORDER — KETOROLAC TROMETHAMINE 30 MG/ML IJ SOLN
INTRAMUSCULAR | Status: DC | PRN
  Administered 2021-07-02: 30 mg via INTRAVENOUS

## 2021-07-02 MED ORDER — PROPOFOL 10 MG/ML IV BOLUS
INTRAVENOUS | Status: DC | PRN
  Administered 2021-07-02: 200 mg via INTRAVENOUS
  Administered 2021-07-02: 40 mg via INTRAVENOUS

## 2021-07-02 MED ORDER — BUPIVACAINE-EPINEPHRINE (PF) 0.5% -1:200000 IJ SOLN
INTRAMUSCULAR | Status: AC
  Filled 2021-07-02: qty 60

## 2021-07-02 MED ORDER — DEXTROSE 5 % IV SOLN
INTRAVENOUS | Status: DC | PRN
  Administered 2021-07-02: 3 g via INTRAVENOUS

## 2021-07-02 MED ORDER — ROCURONIUM BROMIDE 10 MG/ML (PF) SYRINGE
PREFILLED_SYRINGE | INTRAVENOUS | Status: DC | PRN
  Administered 2021-07-02: 40 mg via INTRAVENOUS
  Administered 2021-07-02: 20 mg via INTRAVENOUS

## 2021-07-02 MED ORDER — SUGAMMADEX SODIUM 200 MG/2ML IV SOLN
INTRAVENOUS | Status: DC | PRN
  Administered 2021-07-02: 200 mg via INTRAVENOUS

## 2021-07-02 MED ORDER — SCOPOLAMINE 1 MG/3DAYS TD PT72
1.0000 | MEDICATED_PATCH | Freq: Once | TRANSDERMAL | Status: DC
  Administered 2021-07-02: 1.5 mg via TRANSDERMAL

## 2021-07-02 MED ORDER — ONDANSETRON HCL 4 MG/2ML IJ SOLN
INTRAMUSCULAR | Status: DC | PRN
  Administered 2021-07-02: 4 mg via INTRAVENOUS

## 2021-07-02 MED ORDER — HYDROCODONE-ACETAMINOPHEN 5-325 MG PO TABS
1.0000 | ORAL_TABLET | Freq: Once | ORAL | Status: AC
  Administered 2021-07-02: 1 via ORAL

## 2021-07-02 MED ORDER — PROMETHAZINE HCL 12.5 MG PO TABS: 12.5000 mg | ORAL_TABLET | Freq: Four times a day (QID) | ORAL | 0 refills | Status: AC | PRN

## 2021-07-02 MED ORDER — CHLORHEXIDINE GLUCONATE 0.12 % MT SOLN
OROMUCOSAL | Status: AC
  Filled 2021-07-02: qty 15

## 2021-07-02 MED ORDER — HYDROMORPHONE HCL 1 MG/ML IJ SOLN: 0.2500 mg | INTRAMUSCULAR | Status: DC | PRN

## 2021-07-02 MED ORDER — KETOROLAC TROMETHAMINE 30 MG/ML IJ SOLN
INTRAMUSCULAR | Status: AC
  Filled 2021-07-02: qty 1

## 2021-07-02 MED ORDER — SUCCINYLCHOLINE CHLORIDE 200 MG/10ML IV SOSY
PREFILLED_SYRINGE | INTRAVENOUS | Status: AC
  Filled 2021-07-02: qty 10

## 2021-07-02 MED ORDER — CHLORHEXIDINE GLUCONATE 0.12 % MT SOLN
15.0000 mL | Freq: Once | OROMUCOSAL | Status: AC
  Administered 2021-07-02: 15 mL via OROMUCOSAL

## 2021-07-02 MED ORDER — ONDANSETRON HCL 4 MG/2ML IJ SOLN
4.0000 mg | Freq: Once | INTRAMUSCULAR | Status: AC | PRN
  Administered 2021-07-02: 4 mg via INTRAVENOUS
  Filled 2021-07-02: qty 2

## 2021-07-02 MED ORDER — IBUPROFEN 800 MG PO TABS: 800.0000 mg | ORAL_TABLET | Freq: Three times a day (TID) | ORAL | 2 refills | Status: DC | PRN

## 2021-07-02 MED ORDER — DEXAMETHASONE SODIUM PHOSPHATE 10 MG/ML IJ SOLN
INTRAMUSCULAR | Status: AC
  Filled 2021-07-02: qty 1

## 2021-07-02 MED ORDER — BUPIVACAINE-EPINEPHRINE (PF) 0.5% -1:200000 IJ SOLN
INTRAMUSCULAR | Status: DC | PRN
  Administered 2021-07-02: 30 mL via PERINEURAL

## 2021-07-02 MED ORDER — SEVOFLURANE IN SOLN
RESPIRATORY_TRACT | Status: AC
  Filled 2021-07-02: qty 250

## 2021-07-02 MED ORDER — SUCCINYLCHOLINE CHLORIDE 200 MG/10ML IV SOSY
PREFILLED_SYRINGE | INTRAVENOUS | Status: DC | PRN
  Administered 2021-07-02: 120 mg via INTRAVENOUS

## 2021-07-02 MED ORDER — ORAL CARE MOUTH RINSE: 15.0000 mL | Freq: Once | OROMUCOSAL | Status: AC

## 2021-07-02 MED ORDER — MIDAZOLAM HCL 5 MG/5ML IJ SOLN
INTRAMUSCULAR | Status: DC | PRN
  Administered 2021-07-02: 2 mg via INTRAVENOUS

## 2021-07-02 MED ORDER — MIDAZOLAM HCL 2 MG/2ML IJ SOLN
INTRAMUSCULAR | Status: AC
  Filled 2021-07-02: qty 2

## 2021-07-02 MED ORDER — LIDOCAINE HCL (PF) 2 % IJ SOLN
INTRAMUSCULAR | Status: AC
  Filled 2021-07-02: qty 5

## 2021-07-02 MED ORDER — HYDROCODONE-ACETAMINOPHEN 5-325 MG PO TABS
ORAL_TABLET | ORAL | Status: AC
  Filled 2021-07-02: qty 1

## 2021-07-02 MED ORDER — ROCURONIUM BROMIDE 10 MG/ML (PF) SYRINGE
PREFILLED_SYRINGE | INTRAVENOUS | Status: AC
  Filled 2021-07-02: qty 10

## 2021-07-02 MED ORDER — SCOPOLAMINE 1 MG/3DAYS TD PT72
MEDICATED_PATCH | TRANSDERMAL | Status: AC
  Filled 2021-07-02: qty 1

## 2021-07-02 MED ORDER — DEXAMETHASONE SODIUM PHOSPHATE 10 MG/ML IJ SOLN
INTRAMUSCULAR | Status: DC | PRN
  Administered 2021-07-02: 6 mg via INTRAVENOUS

## 2021-07-02 MED ORDER — FENTANYL CITRATE (PF) 250 MCG/5ML IJ SOLN
INTRAMUSCULAR | Status: AC
  Filled 2021-07-02: qty 5

## 2021-07-02 MED ORDER — MEPERIDINE HCL 50 MG/ML IJ SOLN: 6.2500 mg | INTRAMUSCULAR | Status: DC | PRN

## 2021-07-02 MED ORDER — PROPOFOL 10 MG/ML IV BOLUS
INTRAVENOUS | Status: AC
  Filled 2021-07-02: qty 20

## 2021-07-02 MED ORDER — SODIUM CHLORIDE 0.9 % IR SOLN
Status: DC | PRN
  Administered 2021-07-02 (×3): 3000 mL

## 2021-07-02 MED ORDER — HYDROCODONE-ACETAMINOPHEN 5-325 MG PO TABS: 1.0000 | ORAL_TABLET | ORAL | 0 refills | Status: DC | PRN

## 2021-07-02 SURGICAL SUPPLY — 43 items
APL PRP STRL LF DISP 70% ISPRP (MISCELLANEOUS) ×1
BAG HAMPER (MISCELLANEOUS) ×2 IMPLANT
BLADE EXCALIBUR 4.0X13 (MISCELLANEOUS) ×2 IMPLANT
BLADE SHAVER TORPEDO 4X13 (MISCELLANEOUS) ×2 IMPLANT
BLADE SURG SZ11 CARB STEEL (BLADE) ×2 IMPLANT
BNDG CMPR STD VLCR NS LF 5.8X6 (GAUZE/BANDAGES/DRESSINGS) ×1
BNDG ELASTIC 6X5.8 VLCR NS LF (GAUZE/BANDAGES/DRESSINGS) ×2 IMPLANT
CHLORAPREP W/TINT 26 (MISCELLANEOUS) ×2 IMPLANT
CLOTH BEACON ORANGE TIMEOUT ST (SAFETY) ×2 IMPLANT
COOLER ICEMAN CLASSIC (MISCELLANEOUS) ×2 IMPLANT
CUFF TOURN SGL QUICK 42 (TOURNIQUET CUFF) IMPLANT
DRAPE HALF SHEET 40X57 (DRAPES) ×2 IMPLANT
GAUZE 4X4 16PLY ~~LOC~~+RFID DBL (SPONGE) ×2 IMPLANT
GAUZE SPONGE 4X4 12PLY STRL (GAUZE/BANDAGES/DRESSINGS) ×2 IMPLANT
GAUZE SPONGE 4X4 16PLY XRAY LF (GAUZE/BANDAGES/DRESSINGS) ×2 IMPLANT
GAUZE XEROFORM 5X9 LF (GAUZE/BANDAGES/DRESSINGS) ×2 IMPLANT
GLOVE SS N UNI LF 8.5 STRL (GLOVE) ×2 IMPLANT
GLOVE SURG POLYISO LF SZ8 (GLOVE) ×2 IMPLANT
GLOVE SURG UNDER POLY LF SZ7 (GLOVE) ×4 IMPLANT
GOWN STRL REUS W/TWL LRG LVL3 (GOWN DISPOSABLE) ×2 IMPLANT
GOWN STRL REUS W/TWL XL LVL3 (GOWN DISPOSABLE) ×2 IMPLANT
IV NS IRRIG 3000ML ARTHROMATIC (IV SOLUTION) ×6 IMPLANT
KIT BLADEGUARD II DBL (SET/KITS/TRAYS/PACK) ×2 IMPLANT
KIT TURNOVER CYSTO (KITS) ×2 IMPLANT
MANIFOLD NEPTUNE II (INSTRUMENTS) ×2 IMPLANT
MARKER SKIN DUAL TIP RULER LAB (MISCELLANEOUS) ×2 IMPLANT
NEEDLE HYPO 18GX1.5 BLUNT FILL (NEEDLE) ×2 IMPLANT
NEEDLE HYPO 21X1.5 SAFETY (NEEDLE) ×2 IMPLANT
NEEDLE SPNL 18GX3.5 QUINCKE PK (NEEDLE) ×2 IMPLANT
PACK ARTHRO LIMB DRAPE STRL (MISCELLANEOUS) ×2 IMPLANT
PAD ABD 5X9 TENDERSORB (GAUZE/BANDAGES/DRESSINGS) ×2 IMPLANT
PAD ARMBOARD 7.5X6 YLW CONV (MISCELLANEOUS) ×2 IMPLANT
PAD COLD SHLDR WRAP-ON (PAD) ×2 IMPLANT
PAD FOR LEG HOLDER (MISCELLANEOUS) ×2 IMPLANT
PADDING CAST COTTON 6X4 STRL (CAST SUPPLIES) ×2 IMPLANT
SET ARTHROSCOPY INST (INSTRUMENTS) ×2 IMPLANT
SET BASIN LINEN APH (SET/KITS/TRAYS/PACK) ×2 IMPLANT
STOCKINETTE IMPERVIOUS LG (DRAPES) ×2 IMPLANT
SUT ETHILON 3 0 FSL (SUTURE) ×2 IMPLANT
SYR 10ML LL (SYRINGE) ×2 IMPLANT
SYR 30ML LL (SYRINGE) ×2 IMPLANT
TUBE CONNECTING 12X1/4 (SUCTIONS) ×4 IMPLANT
TUBING IN/OUT FLOW W/MAIN PUMP (TUBING) ×2 IMPLANT

## 2021-07-02 NOTE — Anesthesia Preprocedure Evaluation (Signed)
Anesthesia Evaluation  Patient identified by MRN, date of birth, ID band Patient awake    Reviewed: Allergy & Precautions, NPO status , Patient's Chart, lab work & pertinent test results, reviewed documented beta blocker date and time   History of Anesthesia Complications (+) PONV and history of anesthetic complications  Airway Mallampati: III  TM Distance: >3 FB Neck ROM: Full    Dental  (+) Dental Advisory Given, Teeth Intact   Pulmonary neg pulmonary ROS,    Pulmonary exam normal breath sounds clear to auscultation       Cardiovascular Exercise Tolerance: Good hypertension, Pt. on medications and Pt. on home beta blockers Normal cardiovascular exam Rhythm:Regular Rate:Normal  28-Jun-2021 14:26:18 [facility]  07-05-1971 (50 yr) Female Caucasian Vent. rate 108 BPM PR interval 160 ms QRS duration 78 ms QT/QTcB 348/466 ms P-R-T axes 37 56 26 Sinus tachycardia Otherwise normal ECG Confirmed by Carylon Perches [phone]) on 07/01/2021 7:13:13 PM   Neuro/Psych negative neurological ROS  negative psych ROS   GI/Hepatic Neg liver ROS, GERD  Medicated,  Endo/Other  Morbid obesity  Renal/GU negative Renal ROS     Musculoskeletal negative musculoskeletal ROS (+)   Abdominal   Peds  Hematology negative hematology ROS (+)   Anesthesia Other Findings   Reproductive/Obstetrics negative OB ROS                            Anesthesia Physical Anesthesia Plan  ASA: 3  Anesthesia Plan: General   Post-op Pain Management:    Induction: Intravenous  PONV Risk Score and Plan: 4 or greater and Ondansetron and Dexamethasone  Airway Management Planned: Oral ETT  Additional Equipment:   Intra-op Plan:   Post-operative Plan: Extubation in OR  Informed Consent: I have reviewed the patients History and Physical, chart, labs and discussed the procedure including the risks, benefits and  alternatives for the proposed anesthesia with the patient or authorized representative who has indicated his/her understanding and acceptance.     Dental advisory given  Plan Discussed with: CRNA and Surgeon  Anesthesia Plan Comments:         Anesthesia Quick Evaluation

## 2021-07-02 NOTE — Interval H&P Note (Signed)
History and Physical Interval Note:  07/02/2021 11:41 AM  [name]  has presented today for surgery, with the diagnosis of torn medial meniscus right knee.  The various methods of treatment have been discussed with the patient and family. After consideration of risks, benefits and other options for treatment, the patient has consented to  Procedure(s): KNEE ARTHROSCOPY WITH MEDIAL MENISECTOMY (Right) as a surgical intervention.  The patient's history has been reviewed, patient examined, no change in status, stable for surgery.  I have reviewed the patient's chart and labs.  Questions were answered to the patient's satisfaction.     Fuller Canada

## 2021-07-02 NOTE — Anesthesia Procedure Notes (Signed)
Procedure Name: Intubation Date/Time: 07/02/2021 12:30 PM Performed by: Julian Reil, CRNA Pre-anesthesia Checklist: Patient identified, Emergency Drugs available, Suction available and Patient being monitored Patient Re-evaluated:Patient Re-evaluated prior to induction Oxygen Delivery Method: Circle system utilized Preoxygenation: Pre-oxygenation with 100% oxygen Induction Type: IV induction Ventilation: Mask ventilation without difficulty Laryngoscope Size: Miller and 3 Grade View: Grade I Tube type: Oral Tube size: 7.0 mm Number of attempts: 1 Airway Equipment and Method: Stylet Placement Confirmation: ETT inserted through vocal cords under direct vision, positive ETCO2 and breath sounds checked- equal and bilateral Secured at: 22 cm Tube secured with: Tape Dental Injury: Teeth and Oropharynx as per pre-operative assessment  Comments: 4x4s bite block used.

## 2021-07-02 NOTE — Op Note (Signed)
07/02/2021  1:44 PM  07/02/2021  1:44 PM  Knee arthroscopy dictation  Preop diagnosis torn medial meniscus right knee  Postop diagnosis same  Procedure arthroscopy right knee arthroscopy partial medial meniscectomy chondroplasty medial femoral condyle  Surgeon Romeo Apple  Operative findings  MEDIAL grade 3 lesion of the medial femoral condyle, posterior horn radial tear medial meniscus  LATERAL normal lateral meniscus grade I and II chondromalacia  PTF grade 3 diffuse chondromalacia including patella and trochlea  NOTCH synovitis normal ACL PCL     The patient was identified in the preoperative holding area using 2 approved identification mechanisms. The chart was reviewed and updated. The surgical site was confirmed as right knee and marked with an indelible marker.  The patient was taken to the operating room for anesthesia. After successful general anesthesia, 3 g Ancef was used as IV antibiotics.  The patient was placed in the supine position with the (right) the operative extremity in an arthroscopic leg holder and the opposite extremity in a padded leg holder.  The timeout was executed.  A lateral portal was established with an 11 blade and the scope was introduced into the joint. A diagnostic arthroscopy was performed in circumferential manner examining the entire knee joint. A medial portal was established and the diagnostic arthroscopy was repeated using a probe to palpate intra-articular structures as they were encountered.    The medial meniscus was resected using a duckbill forceps. The meniscal fragments were removed with a motorized shaver. The meniscus was balanced a straight Excalibur shaver.  Loose cartilage flaps from the medial femoral condyle were removed with a straight torpedo shaver  The arthroscopic pump was placed on the wash mode and any excess debris was removed from the joint using suction.  30 cc of Marcaine with epinephrine was injected  through the arthroscope.  The portals were closed with 3-0 nylon suture.  A sterile bandage, Ace wrap and Cryo/Cuff was placed and the Cryo/Cuff was activated. The patient was taken to the recovery room in stable condition.  PHYSICIAN ASSISTANT: no  ASSISTANTS: none   ANESTHESIA:   General  EBL:  none   BLOOD ADMINISTERED:none  DRAINS: none   LOCAL MEDICATIONS USED:  MARCAINE     SPECIMEN:  No Specimen  DISPOSITION OF SPECIMEN:  N/A  COUNTS:  YES   DICTATION: .Dragon Dictation  PLAN OF CARE: Discharge to home after PACU  PATIENT DISPOSITION:  PACU - hemodynamically stable.   Delay start of Pharmacological VTE agent (>24hrs) due to surgical blood loss or risk of bleeding: not applicable

## 2021-07-02 NOTE — Anesthesia Postprocedure Evaluation (Signed)
Anesthesia Post Note  Patient: Jessica [name]  Procedure(s) Performed: KNEE ARTHROSCOPY WITH PARTIAL MEDIAL MENISECTOMY (Right: Knee)  Patient location during evaluation: PACU Anesthesia Type: General Level of consciousness: awake and alert and oriented Pain management: pain level controlled Vital Signs Assessment: post-procedure vital signs reviewed and stable Respiratory status: spontaneous breathing and respiratory function stable Cardiovascular status: blood pressure returned to baseline and stable Postop Assessment: no apparent nausea or vomiting Anesthetic complications: no   No notable events documented.   Last Vitals:  Vitals:   07/02/21 1400 07/02/21 1415  BP: 124/88 (!) 141/79  Pulse: 81 80  Resp: 13 12  Temp:    SpO2: 100% 99%    Last Pain:  Vitals:   07/02/21 1400  TempSrc:   PainSc: 8                  Davone Shinault C Melecio Cueto

## 2021-07-02 NOTE — Transfer of Care (Signed)
Immediate Anesthesia Transfer of Care Note  Patient: Jessica [name]  Procedure(s) Performed: KNEE ARTHROSCOPY WITH PARTIAL MEDIAL MENISECTOMY (Right: Knee)  Patient Location: PACU  Anesthesia Type:General  [name] of Consciousness: awake, alert  and oriented  Airway & Oxygen Therapy: Patient Spontanous Breathing and Patient connected to face mask oxygen  Post-op Assessment: Report given to RN and Post -op Vital signs reviewed and stable  Post vital signs: Reviewed and stable  Last Vitals:  Vitals Value Taken Time  BP 118/64 07/02/21 1341  Temp 36.4 C 07/02/21 1341  Pulse 85 07/02/21 1342  Resp 14 07/02/21 1342  SpO2 99 % 07/02/21 1342  Vitals shown include unvalidated device data.  Last Pain:  Vitals:   07/02/21 1054  TempSrc: Oral  PainSc: 8       Patients Stated Pain Goal: 10 (07/02/21 1054)  Complications: No notable events documented.

## 2021-07-02 NOTE — Brief Op Note (Signed)
07/02/2021  1:40 PM  PATIENT:  Jessica [name]  50 y.o. female  PRE-OPERATIVE DIAGNOSIS:  torn medial meniscus right knee  POST-OPERATIVE DIAGNOSIS:  torn medial meniscus right knee  PROCEDURE:  Procedure(s): KNEE ARTHROSCOPY WITH PARTIAL MEDIAL MENISECTOMY (Right)  SURGEON:  Surgeon(s) and Role:    Vickki Hearing, MD - Primary  PHYSICIAN ASSISTANT:   ASSISTANTS: none   ANESTHESIA:   general  EBL:  0   BLOOD ADMINISTERED:none  DRAINS: none   LOCAL MEDICATIONS USED:  MARCAINE     SPECIMEN:  No Specimen  DISPOSITION OF SPECIMEN:  N/A  COUNTS:  YES  TOURNIQUET:  * No tourniquets in log *  DICTATION: .Dragon Dictation  PLAN OF CARE: Discharge to home after PACU  PATIENT DISPOSITION:  PACU - hemodynamically stable.   Delay start of Pharmacological VTE agent (>24hrs) due to surgical blood loss or risk of bleeding: no

## 2021-07-03 ENCOUNTER — Encounter (HOSPITAL_COMMUNITY): Payer: Self-pay | Admitting: Orthopedic Surgery

## 2021-07-08 DIAGNOSIS — Z9889 Other specified postprocedural states: Secondary | ICD-10-CM | POA: Insufficient documentation

## 2021-07-10 ENCOUNTER — Encounter: Payer: Self-pay | Admitting: Orthopedic Surgery

## 2021-07-10 ENCOUNTER — Other Ambulatory Visit: Payer: Self-pay | Admitting: Radiology

## 2021-07-10 ENCOUNTER — Other Ambulatory Visit: Payer: Self-pay

## 2021-07-10 ENCOUNTER — Ambulatory Visit (INDEPENDENT_AMBULATORY_CARE_PROVIDER_SITE_OTHER): Payer: 59 | Admitting: Orthopedic Surgery

## 2021-07-10 DIAGNOSIS — Z9889 Other specified postprocedural states: Secondary | ICD-10-CM

## 2021-07-10 NOTE — Telephone Encounter (Signed)
Requests refill of Hydrocodone [facility]

## 2021-07-10 NOTE — Progress Notes (Signed)
POST OP   Chief Complaint  Patient presents with   Routine Post Op    DOS 07/02/21    Encounter Diagnosis  Name Primary?   S/P right knee arthroscopy 07/02/21 Yes    PROCEDURE: Arthroscopy medial meniscectomy chondroplasty medial femoral condyle  POV ##1 postop day #8  Meds related: None  She is doing better than expected walks without assistive devices has minimal to no limp she is regained her range of motion has no swelling portals are clean  Recommend 3-week follow-up start quadricep strengthening

## 2021-07-19 ENCOUNTER — Telehealth: Payer: Self-pay | Admitting: Orthopedic Surgery

## 2021-07-19 NOTE — Telephone Encounter (Signed)
07/17/21 patient had called to follow up on forms for [employer], which is the FMLA, completed by [employer], and which updates have been sent since the initial ones - through and including 07/10/21 visit.  States they are needing a form.  07/18/21, I called Loletta Parish [phone], as we had no further communication or requests from then. I reached Maralyn Sago, who then checked with supervisor. States they will fax another form as they need form as well as notes which had been received; states they can then reopen claim. Awaiting form.

## 2021-07-23 ENCOUNTER — Other Ambulatory Visit: Payer: Self-pay | Admitting: Orthopedic Surgery

## 2021-07-23 NOTE — Telephone Encounter (Signed)
Patient following up on refill -  HYDROcodone-acetaminophen (NORCO/VICODIN) 5-325 MG tablet 20 tablet   [facility] in [place]

## 2021-07-23 NOTE — Telephone Encounter (Signed)
Patient aware.

## 2021-07-24 MED ORDER — HYDROCODONE-ACETAMINOPHEN 5-325 MG PO TABS: 1.0000 | ORAL_TABLET | Freq: Four times a day (QID) | ORAL | 0 refills | Status: DC | PRN

## 2021-08-01 ENCOUNTER — Ambulatory Visit (INDEPENDENT_AMBULATORY_CARE_PROVIDER_SITE_OTHER): Payer: 59 | Admitting: Orthopedic Surgery

## 2021-08-01 ENCOUNTER — Encounter: Payer: Self-pay | Admitting: Orthopedic Surgery

## 2021-08-01 ENCOUNTER — Other Ambulatory Visit: Payer: Self-pay

## 2021-08-01 DIAGNOSIS — Z9889 Other specified postprocedural states: Secondary | ICD-10-CM

## 2021-08-01 MED ORDER — HYDROCODONE-ACETAMINOPHEN 5-325 MG PO TABS: 1.0000 | ORAL_TABLET | Freq: Three times a day (TID) | ORAL | 0 refills | Status: AC | PRN

## 2021-08-01 NOTE — Progress Notes (Signed)
Chief Complaint  Patient presents with   Post-op Follow-up    07/02/21 right knee arthroscopy     Patient is here for her postop follow-up status post knee arthroscopy chondroplasty femoral, lateral and medial meniscectomy  She says she is getting better each day.  She has some mild discomfort in her lower leg no pain actually in the knee joint she is concerned about returning to work too soon  Most of her pain comes at night  She exhibits return her for her full range of motion quad strength is 5 out of 5  We discussed return to work options and at this time she would like to wait a couple of weeks perhaps until October 14 and wants to be seen in 2 weeks  She would like a refill on her Norco which we gave her for 5 days at reduced dosing  Recommend she taper off of that and use Tylenol or ibuprofen I will see her in 2 weeks with plan to return to work October 14

## 2021-08-01 NOTE — Patient Instructions (Addendum)
OOW UNTIL OCT 14TH   The post surgical pain should be getting less  Please try tylenol and ibuprofen and ice to control pain   Opioids are for acute pain and should be used for  5- 7 days after surgery and then stopped

## 2021-08-15 ENCOUNTER — Encounter: Payer: Self-pay | Admitting: Orthopedic Surgery

## 2021-08-15 ENCOUNTER — Ambulatory Visit (INDEPENDENT_AMBULATORY_CARE_PROVIDER_SITE_OTHER): Payer: 59 | Admitting: Orthopedic Surgery

## 2021-08-15 ENCOUNTER — Other Ambulatory Visit: Payer: Self-pay

## 2021-08-15 DIAGNOSIS — M797 Fibromyalgia: Secondary | ICD-10-CM | POA: Diagnosis not present

## 2021-08-15 DIAGNOSIS — Z9889 Other specified postprocedural states: Secondary | ICD-10-CM

## 2021-08-15 MED ORDER — METHYLPREDNISOLONE ACETATE 40 MG/ML IJ SUSP
40.0000 mg | Freq: Once | INTRAMUSCULAR | Status: AC
  Administered 2021-08-15: 40 mg via INTRAMUSCULAR

## 2021-08-15 NOTE — Progress Notes (Signed)
Chief Complaint  Patient presents with   Post-op Follow-up    Right knee scope 07/02/21   Procedure arthroscopy right knee arthroscopy partial medial meniscectomy chondroplasty medial femoral condyle   Surgeon Romeo Apple   Operative findings   MEDIAL grade 3 lesion of the medial femoral condyle, posterior horn radial tear medial meniscus   LATERAL normal lateral meniscus grade I and II chondromalacia   PTF grade 3 diffuse chondromalacia including patella and trochlea   NOTCH synovitis normal ACL PCL  The knee is doing well and is not hurting   She would like an injection for fibromyalgia; She cant get in with her doctor    IM injection buttocks for fibromyalgia  After obtaining consent, and per orders of Dr. Romeo Apple , injection of 40 mg depomedrol  given by Fuller Canada. Patient instructed to remain in clinic for 20 minutes afterwards, and to report any adverse reaction to me immediately.

## 2021-08-15 NOTE — Addendum Note (Signed)
Addended byCaffie Damme on: 08/15/2021 11:05 AM   Modules accepted: Orders

## 2021-11-08 ENCOUNTER — Telehealth: Payer: Self-pay | Admitting: Orthopedic Surgery

## 2021-11-08 NOTE — Telephone Encounter (Signed)
Called patient to re-check as to whether any other forms were needed. Reached voice mail - mailbox full.

## 2022-02-28 ENCOUNTER — Other Ambulatory Visit: Payer: Self-pay | Admitting: Orthopedic Surgery

## 2022-03-03 ENCOUNTER — Ambulatory Visit: Payer: 59 | Admitting: Orthopedic Surgery

## 2022-03-03 DIAGNOSIS — Z9889 Other specified postprocedural states: Secondary | ICD-10-CM

## 2022-03-03 DIAGNOSIS — G8929 Other chronic pain: Secondary | ICD-10-CM

## 2022-03-03 DIAGNOSIS — M25561 Pain in right knee: Secondary | ICD-10-CM

## 2022-03-03 NOTE — Patient Instructions (Signed)

## 2022-03-03 NOTE — Progress Notes (Signed)
FOLLOW UP  ? ?Encounter Diagnoses  ?Name Primary?  ? S/P right knee arthroscopy 07/02/21 Yes  ? Chronic pain of right knee   ? ? ? ?Chief Complaint  ?Patient presents with  ? Knee Pain  ?  RT knee/  ?S/p arthroscopy 07/02/21  ? ? ? ?Injection requested starting having pain in the right knee again ? ?She would like to try an injection ? ?Procedure note right knee injection  ? ?verbal consent was obtained to inject right knee joint ? ?Timeout was completed to confirm the site of injection ? ?The medications used were depomedrol 40 mg and 1% lidocaine 3 cc ?Anesthesia was provided by ethyl chloride and the skin was prepped with alcohol. ? ?After cleaning the skin with alcohol a 20-gauge needle was used to inject the right knee joint. There were no complications. A sterile bandage was applied.  ?

## 2022-04-20 IMAGING — MR MR KNEE*R* W/O CM
6 series · 40 of 40 positions shown · non-contrast
Comparison: None.

CLINICAL DATA: Right knee pain.

EXAM:
MRI OF THE RIGHT KNEE WITHOUT CONTRAST
TECHNIQUE: Multiplanar, multisequence MR imaging of the knee was performed. No
intravenous contrast was administered.

[Series 3: T2 fat-sat · axial · right · 4.0mm · 0.62mm/px · z∈[-59,+95]mm · 7 of 36 slices shown (1 of 3)]
[im 1/36]
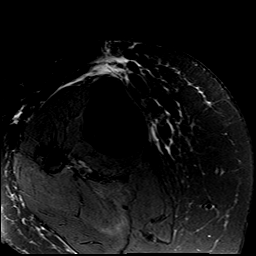
[im 6/36]
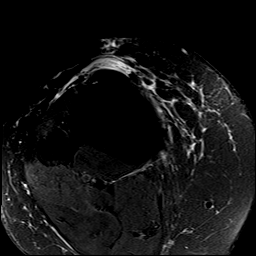
[im 12/36]
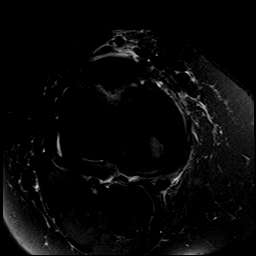
[im 18/36]
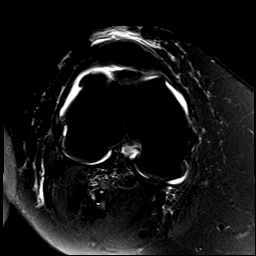
[im 24/36]
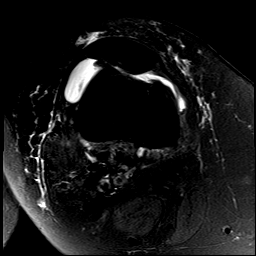
[im 30/36]
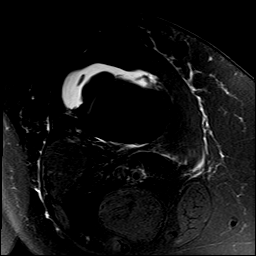
[im 36/36]
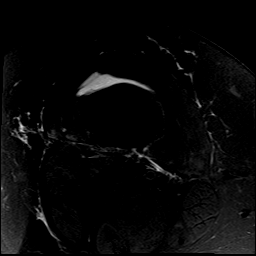

[Series 4: T2 fat-sat · coronal · right · 3.0mm · 0.70mm/px · 7 of 38 slices shown (2 of 3)]
[im 1/38]
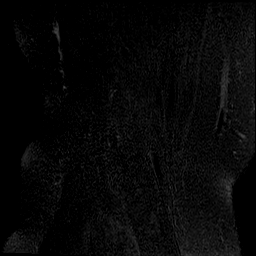
[im 7/38]
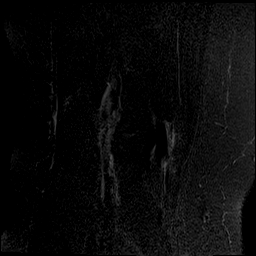
[im 13/38]
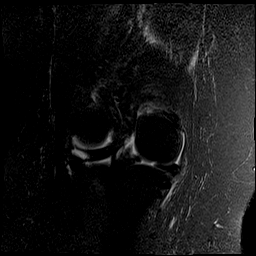
[im 19/38]
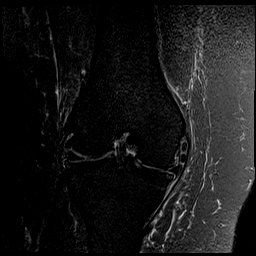
[im 25/38]
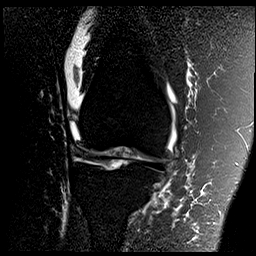
[im 31/38]
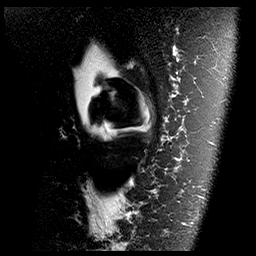
[im 38/38]
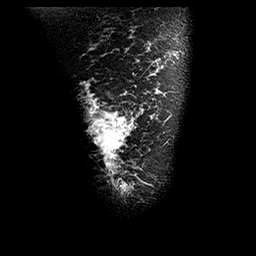

[Series 5: T1 · coronal · right · 3.0mm · 0.70mm/px · 7 of 38 slices shown]
[im 1/38]
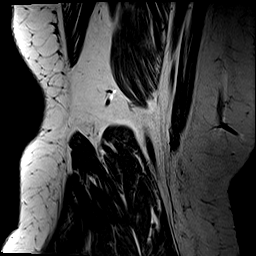
[im 7/38]
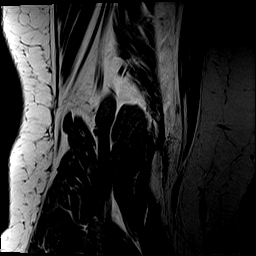
[im 13/38]
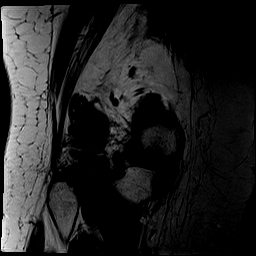
[im 19/38]
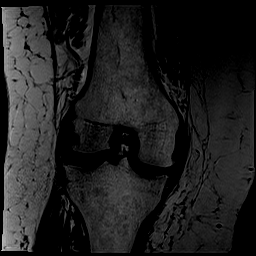
[im 25/38]
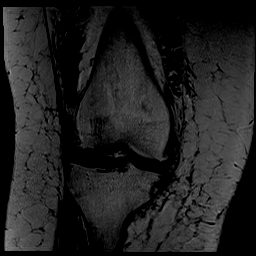
[im 31/38]
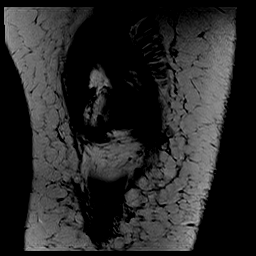
[im 38/38]
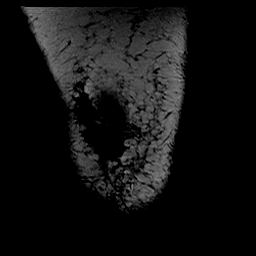

[Series 6: PD fat-sat · coronal · right · 3.0mm · 0.70mm/px · 7 of 38 slices shown (1 of 2)]
[im 1/38]
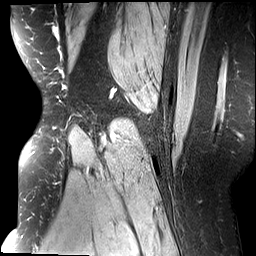
[im 7/38]
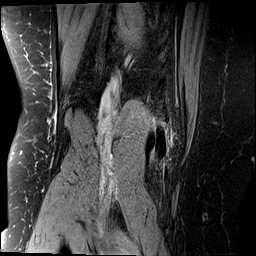
[im 13/38]
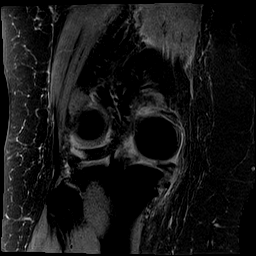
[im 19/38]
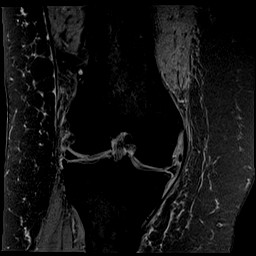
[im 25/38]
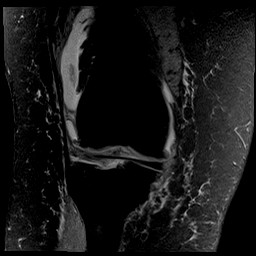
[im 31/38]
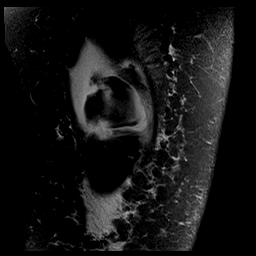
[im 38/38]
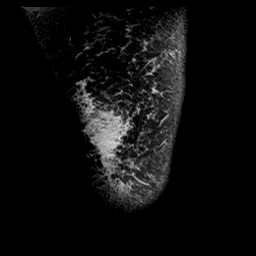

[Series 7: PD fat-sat · sagittal · right · 3.2mm · 0.70mm/px · 6 of 35 slices shown (2 of 2)]
[im 1/35]
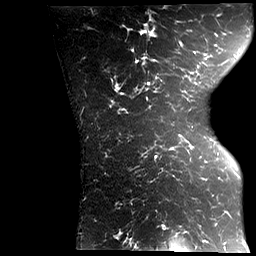
[im 7/35]
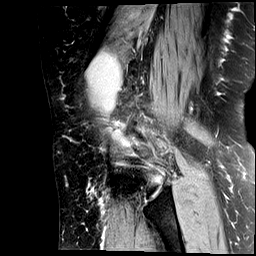
[im 14/35]
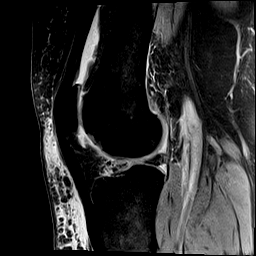
[im 21/35]
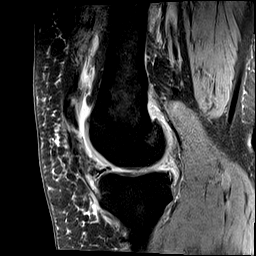
[im 28/35]
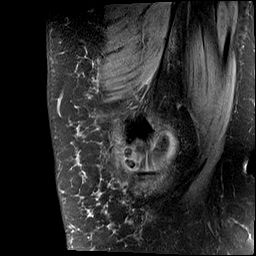
[im 35/35]
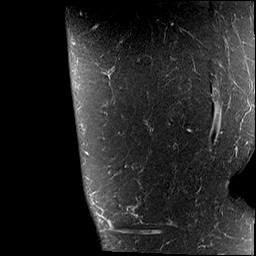

[Series 8: T2 fat-sat · sagittal · right · 3.2mm · 0.70mm/px · 6 of 35 slices shown (3 of 3)]
[im 1/35]
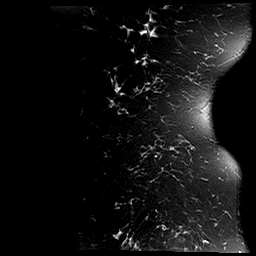
[im 7/35]
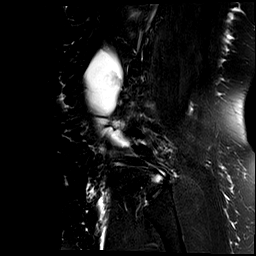
[im 14/35]
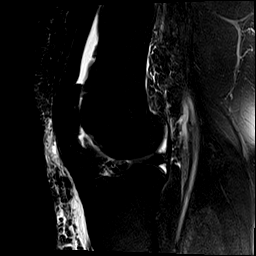
[im 21/35]
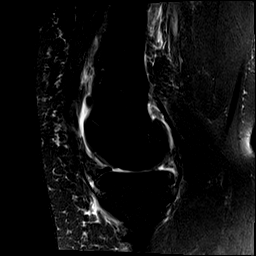
[im 28/35]
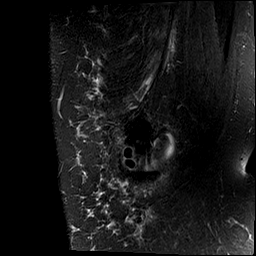
[im 35/35]
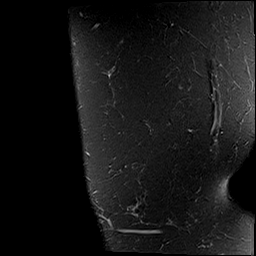

[40 of 40 positions shown; findings below may reference images not displayed]

FINDINGS: MENISCI

Medial: Radial tear of the posterior horn of the medial meniscus
with peripheral meniscal extrusion. Degeneration of the body of the
medial meniscus.

Lateral: Intact.

LIGAMENTS

Cruciates: ACL and PCL are intact.

Collaterals: Medial collateral ligament is intact. Lateral
collateral ligament complex is intact.

CARTILAGE

Patellofemoral: Partial-thickness cartilage loss of the
patellofemoral compartment with high-grade partial-thickness
cartilage loss of the lateral patellofemoral compartment.

Medial: High-grade partial-thickness cartilage loss of the medial
femorotibial compartment.

Lateral: Mild partial thickness cartilage loss of the lateral
femorotibial compartment.

JOINT: Moderate joint effusion. Normal Sifatullah Tnha. No plical
thickening.

POPLITEAL FOSSA: Popliteus tendon is intact. No Baker's cyst. Small
loose bodies in the periphery of the medial femorotibial
compartment.

EXTENSOR MECHANISM: Intact quadriceps tendon. Intact patellar
tendon. Intact lateral patellar retinaculum. Intact medial patellar
retinaculum. Intact MPFL.

BONES: No aggressive osseous lesion. No fracture or dislocation.

Other: No fluid collection or hematoma. Muscles are normal. Mild
soft tissue edema in the prepatellar tendon soft tissues.
IMPRESSION: 1. Radial tear of the posterior horn of the medial meniscus with
peripheral meniscal extrusion. Degeneration of the body of the
medial meniscus.
2. Tricompartmental cartilage abnormalities as described above most
severe in the medial femorotibial compartment.

## 2022-04-29 DIAGNOSIS — R197 Diarrhea, unspecified: Secondary | ICD-10-CM | POA: Insufficient documentation

## 2022-04-29 DIAGNOSIS — E860 Dehydration: Secondary | ICD-10-CM | POA: Insufficient documentation

## 2022-04-29 DIAGNOSIS — N179 Acute kidney failure, unspecified: Secondary | ICD-10-CM | POA: Insufficient documentation

## 2022-04-29 DIAGNOSIS — R112 Nausea with vomiting, unspecified: Secondary | ICD-10-CM | POA: Insufficient documentation

## 2022-04-29 DIAGNOSIS — E871 Hypo-osmolality and hyponatremia: Secondary | ICD-10-CM | POA: Insufficient documentation

## 2022-04-29 DIAGNOSIS — M797 Fibromyalgia: Secondary | ICD-10-CM | POA: Insufficient documentation

## 2023-01-06 ENCOUNTER — Telehealth: Payer: Self-pay | Admitting: Orthopaedic Surgery

## 2023-01-07 ENCOUNTER — Encounter: Payer: Self-pay | Admitting: Orthopaedic Surgery

## 2023-01-07 ENCOUNTER — Ambulatory Visit: Payer: 59 | Admitting: Orthopaedic Surgery

## 2023-01-07 VITALS — BP 152/8 | HR 83 | Ht 68.0 in | Wt 327.0 lb

## 2023-01-07 DIAGNOSIS — M79672 Pain in left foot: Secondary | ICD-10-CM | POA: Diagnosis not present

## 2023-01-07 DIAGNOSIS — G8929 Other chronic pain: Secondary | ICD-10-CM | POA: Diagnosis not present

## 2023-01-07 MED ORDER — HYDROCODONE-ACETAMINOPHEN 5-325 MG PO TABS: ORAL_TABLET | ORAL | 0 refills | Status: DC

## 2023-01-07 NOTE — Patient Instructions (Addendum)
 Medication Instructions:  TAKE NEW MEDICATION AS DIRECTED  Testing/Procedures: NONE  Follow-Up: 1 MONTH  If you need a refill on your  medications prescribed by Dr. Luna Glasgow before your next appointment, please call your pharmacy.  DR.KEELING'S SCHEDULE IS AS FOLLOWS:  TUESDAY: ALL DAY WEDNESDAY: MORNING ONLY THURSDAY: MORNING ONLY PLEASE CALL OR SEND A MESSAGE VIA MYCHART BY WEDNESDAY MORNING SO THAT I CAN SEND A REQUEST TO HIM. HE LEAVES THURSDAY BY 11:30AM. HE DOES NOT RETURN TO THE OFFICE UNTIL TUESDAY MORNINGS AND HE DOES NOT CHECK HIS WORK MESSAGES DURING HIS TIME AWAY FROM THE OFFICE. I'M  AND IF YOU NEED SOMETHING, SEND ME A MYCHART MESSAGE OR CALL. MYCHART IS THE FASTEST WAY TO GET IN CONTACT WITH ME.

## 2023-01-07 NOTE — Progress Notes (Signed)
My heel hurts again  She has recurrent heel pain on the left after several years with no pain.  She has no new trauma. She has changed shoes without help.  She cannot take NSAIDs.  She has no redness.  She has pain after being up on her feet several hours at work.  Left foot has pes planus and heel pain mid portion of heel.  There is no swelling or redness.  Gait is good.  NV intact.  Encounter Diagnosis  Name Primary?   Chronic heel pain, left Yes   I have told her about heel inserts or pads for shoes and use of ice and cold tin can to roll.    I have reviewed the [place] web site prior to prescribing narcotic medicine for this patient.  Return in one month.  Call if any problem.  Precautions discussed.  She needs a new knee sleeve.  The one she has has worn out.  I will replace today.  Electronically Signed Sanjuana Kava, MD 2/21/202411:35 AM

## 2023-01-14 ENCOUNTER — Telehealth: Payer: Self-pay | Admitting: Orthopaedic Surgery

## 2023-01-14 NOTE — Telephone Encounter (Signed)
Patient called, requesting a refill on Hydrocodone 5-325 to be sent to [facility].  Pt's # [id number]

## 2023-01-15 MED ORDER — HYDROCODONE-ACETAMINOPHEN 5-325 MG PO TABS: ORAL_TABLET | ORAL | 0 refills | Status: DC

## 2023-02-04 ENCOUNTER — Encounter: Payer: Self-pay | Admitting: Orthopaedic Surgery

## 2023-02-04 ENCOUNTER — Ambulatory Visit: Payer: 59 | Admitting: Orthopaedic Surgery

## 2023-02-04 VITALS — BP 141/84 | HR 79

## 2023-02-04 DIAGNOSIS — M79672 Pain in left foot: Secondary | ICD-10-CM

## 2023-02-04 DIAGNOSIS — G8929 Other chronic pain: Secondary | ICD-10-CM

## 2023-02-04 MED ORDER — HYDROCODONE-ACETAMINOPHEN 5-325 MG PO TABS: ORAL_TABLET | ORAL | 0 refills | Status: AC

## 2023-02-04 MED ORDER — METHYLPREDNISOLONE ACETATE 40 MG/ML IJ SUSP
40.0000 mg | Freq: Once | INTRAMUSCULAR | Status: AC
  Administered 2023-02-04: 40 mg via INTRA_ARTICULAR

## 2023-02-04 NOTE — Addendum Note (Signed)
Addended by: Obie Dredge A on: 02/04/2023 11:55 AM   Modules accepted: Orders

## 2023-02-04 NOTE — Progress Notes (Signed)
My heel hurts.  She has continued pain in the left heel.  Nothing seems to help. She is on her feet 12 hours a day at work.  She went in at 5 am today.  She has no new trauma.  She has tried inserts with no help.    Left heel is tender, no redness, no swelling.  Encounter Diagnosis  Name Primary?   Chronic heel pain, left Yes   Procedure note: After permission from the patient, I injected 3 cc plain Xylocaine 1% and 1 cc DepoMedrol 40 into the area of pain in the plantar heel area on the left by sterile technique tolerated well.  Return prn.  Call if any problem.  Precautions discussed.  Electronically Signed Sanjuana Kava, MD 3/20/202411:00 AM

## 2023-02-05 ENCOUNTER — Ambulatory Visit: Payer: 59 | Admitting: Orthopaedic Surgery

## 2023-05-06 ENCOUNTER — Ambulatory Visit (INDEPENDENT_AMBULATORY_CARE_PROVIDER_SITE_OTHER): Payer: 59 | Admitting: Orthopaedic Surgery

## 2023-05-06 ENCOUNTER — Encounter: Payer: Self-pay | Admitting: Orthopaedic Surgery

## 2023-05-06 VITALS — BP 130/78 | HR 76 | Ht 69.0 in | Wt 341.0 lb

## 2023-05-06 DIAGNOSIS — G8929 Other chronic pain: Secondary | ICD-10-CM | POA: Diagnosis not present

## 2023-05-06 DIAGNOSIS — M79672 Pain in left foot: Secondary | ICD-10-CM

## 2023-05-06 MED ORDER — HYDROCODONE-ACETAMINOPHEN 5-325 MG PO TABS: 1.0000 | ORAL_TABLET | ORAL | 0 refills | Status: AC | PRN

## 2023-05-06 NOTE — Progress Notes (Signed)
My heel is hurting again.  She has lateral left heel pain today that is getting worse. She has pain at work and is limping.  Ice and Tylenol have not helped.  She has a trigger point and marked pain of the lateral left heel.  There is no redness or swelling.  Encounter Diagnosis  Name Primary?   Chronic heel pain, left Yes   Procedure note:  After permission from the patient and prep of the left lateral heel, I injected by sterile technique 1 % Xylocaine and 1 cc DepoMedrol 40 into the left heel tolerated well.  Return prn.  I have reviewed the [place] Controlled Substance Reporting System web site prior to prescribing narcotic medicine for this patient.   Call if any problem.  Precautions discussed.  Electronically Signed Darreld Mclean, MD 6/19/20248:33 AM

## 2023-06-29 ENCOUNTER — Ambulatory Visit: Payer: 59 | Admitting: Orthopedic Surgery

## 2023-07-01 ENCOUNTER — Ambulatory Visit: Payer: 59 | Admitting: Orthopaedic Surgery

## 2023-07-06 ENCOUNTER — Ambulatory Visit: Payer: 59 | Admitting: Orthopedic Surgery

## 2023-08-07 ENCOUNTER — Ambulatory Visit (INDEPENDENT_AMBULATORY_CARE_PROVIDER_SITE_OTHER): Payer: 59 | Admitting: Orthopedic Surgery

## 2023-08-07 ENCOUNTER — Encounter: Payer: Self-pay | Admitting: Orthopedic Surgery

## 2023-08-07 DIAGNOSIS — G8929 Other chronic pain: Secondary | ICD-10-CM

## 2023-08-07 DIAGNOSIS — Z9889 Other specified postprocedural states: Secondary | ICD-10-CM

## 2023-08-07 DIAGNOSIS — M25561 Pain in right knee: Secondary | ICD-10-CM | POA: Diagnosis not present

## 2023-08-07 DIAGNOSIS — M1711 Unilateral primary osteoarthritis, right knee: Secondary | ICD-10-CM

## 2023-08-07 MED ORDER — METHYLPREDNISOLONE ACETATE 40 MG/ML IJ SUSP
40.0000 mg | Freq: Once | INTRAMUSCULAR | Status: AC
  Administered 2023-08-07: 40 mg via INTRA_ARTICULAR

## 2023-08-07 NOTE — Addendum Note (Signed)
Addended byCaffie Damme on: 08/07/2023 08:38 AM   Modules accepted: Orders

## 2023-08-07 NOTE — Progress Notes (Signed)
Chief Complaint  Patient presents with   Injections    Righr knee    Encounter Diagnoses  Name Primary?   S/P right knee arthroscopy 07/02/21    Chronic pain of right knee    Primary osteoarthritis of right knee Yes    Procedure note right knee injection   verbal consent was obtained to inject right knee joint  Timeout was completed to confirm the site of injection  The medications used were depomedrol 40 mg and 1% lidocaine 3 cc Anesthesia was provided by ethyl chloride and the skin was prepped with alcohol.  After cleaning the skin with alcohol a 20-gauge needle was used to inject the right knee joint. There were no complications. A sterile bandage was applied.   Call or return to clinic prn if these symptoms worsen or fail to improve as anticipated.
